# Patient Record
Sex: Female | Born: 1945 | Race: Black or African American | Hispanic: No | Marital: Married | Smoking: Never smoker
Health system: Southern US, Community
[De-identification: ages and names within clinical notes are randomized; demographics above are authoritative.]

## PROBLEM LIST (undated history)

## (undated) DIAGNOSIS — Z9889 Other specified postprocedural states: Secondary | ICD-10-CM

## (undated) DIAGNOSIS — I10 Essential (primary) hypertension: Secondary | ICD-10-CM

## (undated) DIAGNOSIS — Z9289 Personal history of other medical treatment: Secondary | ICD-10-CM

## (undated) DIAGNOSIS — J45909 Unspecified asthma, uncomplicated: Secondary | ICD-10-CM

## (undated) DIAGNOSIS — E785 Hyperlipidemia, unspecified: Secondary | ICD-10-CM

## (undated) DIAGNOSIS — E119 Type 2 diabetes mellitus without complications: Secondary | ICD-10-CM

## (undated) DIAGNOSIS — I639 Cerebral infarction, unspecified: Secondary | ICD-10-CM

## (undated) DIAGNOSIS — E039 Hypothyroidism, unspecified: Secondary | ICD-10-CM

## (undated) HISTORY — PX: CARPAL TUNNEL RELEASE: SHX101

## (undated) HISTORY — PX: OTHER SURGICAL HISTORY: SHX169

## (undated) HISTORY — DX: Other specified postprocedural states: Z98.890

## (undated) HISTORY — PX: APPENDECTOMY: SHX54

## (undated) HISTORY — DX: Hypothyroidism, unspecified: E03.9

## (undated) HISTORY — DX: Personal history of other medical treatment: Z92.89

## (undated) HISTORY — PX: CARDIAC CATHETERIZATION: SHX172

## (undated) HISTORY — DX: Hyperlipidemia, unspecified: E78.5

---

## 2008-11-21 DIAGNOSIS — R1310 Dysphagia, unspecified: Secondary | ICD-10-CM | POA: Insufficient documentation

## 2009-09-20 DIAGNOSIS — R109 Unspecified abdominal pain: Secondary | ICD-10-CM | POA: Insufficient documentation

## 2011-06-26 DIAGNOSIS — Z8719 Personal history of other diseases of the digestive system: Secondary | ICD-10-CM | POA: Insufficient documentation

## 2011-06-26 DIAGNOSIS — E119 Type 2 diabetes mellitus without complications: Secondary | ICD-10-CM | POA: Insufficient documentation

## 2011-06-26 DIAGNOSIS — E785 Hyperlipidemia, unspecified: Secondary | ICD-10-CM | POA: Insufficient documentation

## 2013-04-26 ENCOUNTER — Inpatient Hospital Stay (HOSPITAL_COMMUNITY)
Admission: EM | Admit: 2013-04-26 | Discharge: 2013-04-30 | DRG: 287 | Disposition: A | Discharge status: Home / Self Care | Payer: Medicare Other | Attending: Internal Medicine | Admitting: Internal Medicine

## 2013-04-26 ENCOUNTER — Encounter (HOSPITAL_COMMUNITY): Payer: Self-pay | Admitting: *Deleted

## 2013-04-26 ENCOUNTER — Emergency Department (HOSPITAL_COMMUNITY): Payer: Medicare Other

## 2013-04-26 ENCOUNTER — Other Ambulatory Visit (HOSPITAL_COMMUNITY): Payer: Self-pay

## 2013-04-26 DIAGNOSIS — E119 Type 2 diabetes mellitus without complications: Secondary | ICD-10-CM | POA: Diagnosis present

## 2013-04-26 DIAGNOSIS — Z7982 Long term (current) use of aspirin: Secondary | ICD-10-CM

## 2013-04-26 DIAGNOSIS — K219 Gastro-esophageal reflux disease without esophagitis: Secondary | ICD-10-CM | POA: Diagnosis present

## 2013-04-26 DIAGNOSIS — R0789 Other chest pain: Principal | ICD-10-CM | POA: Diagnosis present

## 2013-04-26 DIAGNOSIS — I10 Essential (primary) hypertension: Secondary | ICD-10-CM | POA: Diagnosis present

## 2013-04-26 DIAGNOSIS — H8110 Benign paroxysmal vertigo, unspecified ear: Secondary | ICD-10-CM | POA: Diagnosis present

## 2013-04-26 DIAGNOSIS — Z79899 Other long term (current) drug therapy: Secondary | ICD-10-CM

## 2013-04-26 DIAGNOSIS — Z9849 Cataract extraction status, unspecified eye: Secondary | ICD-10-CM

## 2013-04-26 DIAGNOSIS — E039 Hypothyroidism, unspecified: Secondary | ICD-10-CM | POA: Diagnosis present

## 2013-04-26 DIAGNOSIS — Z8673 Personal history of transient ischemic attack (TIA), and cerebral infarction without residual deficits: Secondary | ICD-10-CM

## 2013-04-26 DIAGNOSIS — R079 Chest pain, unspecified: Secondary | ICD-10-CM

## 2013-04-26 DIAGNOSIS — E785 Hyperlipidemia, unspecified: Secondary | ICD-10-CM | POA: Diagnosis present

## 2013-04-26 DIAGNOSIS — M47814 Spondylosis without myelopathy or radiculopathy, thoracic region: Secondary | ICD-10-CM | POA: Diagnosis present

## 2013-04-26 DIAGNOSIS — R9439 Abnormal result of other cardiovascular function study: Secondary | ICD-10-CM | POA: Diagnosis not present

## 2013-04-26 DIAGNOSIS — I2510 Atherosclerotic heart disease of native coronary artery without angina pectoris: Secondary | ICD-10-CM | POA: Diagnosis present

## 2013-04-26 DIAGNOSIS — Z9089 Acquired absence of other organs: Secondary | ICD-10-CM

## 2013-04-26 DIAGNOSIS — R42 Dizziness and giddiness: Secondary | ICD-10-CM

## 2013-04-26 DIAGNOSIS — I517 Cardiomegaly: Secondary | ICD-10-CM

## 2013-04-26 DIAGNOSIS — Z86711 Personal history of pulmonary embolism: Secondary | ICD-10-CM

## 2013-04-26 DIAGNOSIS — E7800 Pure hypercholesterolemia, unspecified: Secondary | ICD-10-CM | POA: Diagnosis present

## 2013-04-26 HISTORY — DX: Cerebral infarction, unspecified: I63.9

## 2013-04-26 HISTORY — DX: Type 2 diabetes mellitus without complications: E11.9

## 2013-04-26 HISTORY — DX: Essential (primary) hypertension: I10

## 2013-04-26 LAB — CBC WITH DIFFERENTIAL/PLATELET
Basophils Absolute: 0.1 10*3/uL (ref 0.0–0.1)
Basophils Relative: 1 % (ref 0–1)
Eosinophils Absolute: 0.3 10*3/uL (ref 0.0–0.7)
HCT: 41.3 % (ref 36.0–46.0)
Hemoglobin: 14.2 g/dL (ref 12.0–15.0)
Lymphocytes Relative: 29 % (ref 12–46)
MCH: 30.5 pg (ref 26.0–34.0)
MCHC: 34.4 g/dL (ref 30.0–36.0)
MCV: 88.6 fL (ref 78.0–100.0)
Monocytes Absolute: 0.5 10*3/uL (ref 0.1–1.0)
Neutro Abs: 4.8 10*3/uL (ref 1.7–7.7)
Neutrophils Relative %: 61 % (ref 43–77)
RDW: 12.8 % (ref 11.5–15.5)
WBC: 8 10*3/uL (ref 4.0–10.5)

## 2013-04-26 LAB — RAPID URINE DRUG SCREEN, HOSP PERFORMED
Benzodiazepines: NOT DETECTED
Cocaine: NOT DETECTED
Opiates: NOT DETECTED

## 2013-04-26 LAB — GLUCOSE, CAPILLARY
Glucose-Capillary: 91 mg/dL (ref 70–99)
Glucose-Capillary: 165 mg/dL — ABNORMAL HIGH (ref 70–99)

## 2013-04-26 LAB — BASIC METABOLIC PANEL
CO2: 29 mEq/L (ref 19–32)
Chloride: 100 mEq/L (ref 96–112)
Creatinine, Ser: 0.81 mg/dL (ref 0.50–1.10)
GFR calc Af Amer: 84 mL/min — ABNORMAL LOW (ref >90)
Potassium: 3.7 mEq/L (ref 3.5–5.1)

## 2013-04-26 LAB — CREATININE, SERUM
GFR calc Af Amer: 76 mL/min — ABNORMAL LOW (ref >90)
GFR calc non Af Amer: 66 mL/min — ABNORMAL LOW (ref >90)

## 2013-04-26 LAB — CBC
HCT: 40.1 % (ref 36.0–46.0)
MCHC: 32.9 g/dL (ref 30.0–36.0)
RBC: 4.51 MIL/uL (ref 3.87–5.11)
RDW: 12.8 % (ref 11.5–15.5)
WBC: 8.4 10*3/uL (ref 4.0–10.5)

## 2013-04-26 LAB — TROPONIN I: Troponin I: <0.3 ng/mL (ref <0.30)

## 2013-04-26 LAB — POCT I-STAT TROPONIN I

## 2013-04-26 MED ORDER — IRBESARTAN 300 MG PO TABS
300.0000 mg | ORAL_TABLET | Freq: Every day | ORAL | Status: DC
  Administered 2013-04-26 – 2013-04-30 (×5): 300 mg via ORAL
  Filled 2013-04-26 (×5): qty 1

## 2013-04-26 MED ORDER — AMLODIPINE BESYLATE 5 MG PO TABS
5.0000 mg | ORAL_TABLET | Freq: Every day | ORAL | Status: DC
  Administered 2013-04-26 – 2013-04-30 (×5): 5 mg via ORAL
  Filled 2013-04-26 (×5): qty 1

## 2013-04-26 MED ORDER — NITROGLYCERIN 2 % TD OINT
1.0000 [in_us] | TOPICAL_OINTMENT | Freq: Once | TRANSDERMAL | Status: AC
  Administered 2013-04-26: 1 [in_us] via TOPICAL
  Filled 2013-04-26: qty 1

## 2013-04-26 MED ORDER — OXYBUTYNIN CHLORIDE ER 5 MG PO TB24
5.0000 mg | ORAL_TABLET | Freq: Every day | ORAL | Status: DC
  Administered 2013-04-27 – 2013-04-30 (×4): 5 mg via ORAL
  Filled 2013-04-26 (×5): qty 1

## 2013-04-26 MED ORDER — PANTOPRAZOLE SODIUM 40 MG PO TBEC: 40.0000 mg | DELAYED_RELEASE_TABLET | Freq: Every day | ORAL | Status: DC

## 2013-04-26 MED ORDER — REGADENOSON 0.4 MG/5ML IV SOLN
0.4000 mg | Freq: Once | INTRAVENOUS | Status: DC
Start: 2013-04-27 — End: 2013-04-29
  Filled 2013-04-26: qty 5

## 2013-04-26 MED ORDER — LEVOTHYROXINE SODIUM 75 MCG PO TABS
75.0000 ug | ORAL_TABLET | Freq: Every day | ORAL | Status: DC
  Administered 2013-04-27 – 2013-04-30 (×4): 75 ug via ORAL
  Filled 2013-04-26 (×5): qty 1

## 2013-04-26 MED ORDER — ACETAMINOPHEN 325 MG PO TABS
650.0000 mg | ORAL_TABLET | ORAL | Status: DC | PRN
  Filled 2013-04-26: qty 2

## 2013-04-26 MED ORDER — OLMESARTAN-AMLODIPINE-HCTZ 40-5-12.5 MG PO TABS: 1.0000 | ORAL_TABLET | Freq: Every day | ORAL | Status: DC

## 2013-04-26 MED ORDER — GI COCKTAIL ~~LOC~~: 30.0000 mL | Freq: Four times a day (QID) | ORAL | Status: DC | PRN

## 2013-04-26 MED ORDER — ASPIRIN EC 325 MG PO TBEC
325.0000 mg | DELAYED_RELEASE_TABLET | Freq: Every day | ORAL | Status: DC
  Administered 2013-04-28 – 2013-04-30 (×3): 325 mg via ORAL
  Filled 2013-04-26 (×3): qty 1

## 2013-04-26 MED ORDER — ENOXAPARIN SODIUM 40 MG/0.4ML ~~LOC~~ SOLN
40.0000 mg | SUBCUTANEOUS | Status: DC
  Administered 2013-04-26 – 2013-04-28 (×3): 40 mg via SUBCUTANEOUS
  Filled 2013-04-26 (×4): qty 0.4

## 2013-04-26 MED ORDER — ATORVASTATIN CALCIUM 80 MG PO TABS
80.0000 mg | ORAL_TABLET | Freq: Every day | ORAL | Status: DC
  Administered 2013-04-26 – 2013-04-30 (×5): 80 mg via ORAL
  Filled 2013-04-26 (×5): qty 1

## 2013-04-26 MED ORDER — HYDROCHLOROTHIAZIDE 12.5 MG PO CAPS
12.5000 mg | ORAL_CAPSULE | Freq: Every day | ORAL | Status: DC
  Administered 2013-04-26 – 2013-04-30 (×4): 12.5 mg via ORAL
  Filled 2013-04-26 (×5): qty 1

## 2013-04-26 MED ORDER — ASPIRIN 81 MG PO CHEW
324.0000 mg | CHEWABLE_TABLET | Freq: Once | ORAL | Status: AC
  Administered 2013-04-26: 324 mg via ORAL
  Filled 2013-04-26: qty 4

## 2013-04-26 MED ORDER — ONDANSETRON HCL 4 MG/2ML IJ SOLN
4.0000 mg | Freq: Four times a day (QID) | INTRAMUSCULAR | Status: DC | PRN
  Administered 2013-04-27: 4 mg via INTRAVENOUS
  Filled 2013-04-26: qty 2

## 2013-04-26 NOTE — H&P (Signed)
Date: 04/26/2013               Patient Name:  Katrina Swanson MRN: [id number]  DOB: Feb 02, 1946 Age / Sex: 67 y.o., female   PCP: Dr. Fanny Dance, MD [facility], [place])         Medical Service: Internal Medicine Teaching Service         Attending Physician: Dr. Inez Catalina, MD    First Contact: Dr. Delane Ginger Pager: [phone]  Second Contact: Dr. Dorise Hiss Pager: [phone]       After Hours (After 5p/  First Contact Pager: [phone]  weekends / holidays): Second Contact Pager: [phone]   Chief Complaint: Chest Pain  History of Present Illness: Katrina Swanson is a 67 yo AAF from [place], [place] with a PMH of HTN, DM2 (pt reports last HA1C 6.1), dyslipidemia, hypothyroidism, GERD, and CVA (2012). She presents to the ED with 2-3 weeks of "on and off" substernal chest pain with radiation into her left shoulder and under her left breast. She describes the pain as "dull like anxiety" and lasts about 20-30 minutes at a time. She endorses some associated HA, SOB, nausea (no emesis), and dizziness. She reports the pain is mainly nonexertional and happens mainly after she "has eaten too much" or when she lies down. She has dyspepsia at times but not as long as she takes her protonix and zantac. She denies any new exercises or trauma to the chest, however she does state that she sometimes works in her flower garden and has some associated chest pain but this feels different to her. She states the pain is non-tender to palpation and not associated with inspiration or changes in position. She denies any episodes of similar pain in the past. 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   Of note, she reportedly saw her PCP for these issues who told her she  needed a MRI to r/o a new stroke. Therefore, she was initially worked up as a possible CVA.   Meds:  Prescriptions prior to admission  Medication Sig Dispense Refill  . aspirin 325 MG tablet Take 325 mg by mouth daily.      Marland Kitchen atorvastatin (LIPITOR) 80 MG tablet Take 80 mg by mouth daily.      Marland Kitchen levothyroxine (SYNTHROID, LEVOTHROID) 75 MCG tablet Take 75 mcg by mouth daily before breakfast.      . metFORMIN (GLUCOPHAGE) 500 MG tablet Take 500 mg by mouth 2 (two) times daily with a meal.      . nitroGLYCERIN (NITROSTAT) 0.4 MG SL tablet Place 0.4 mg under the tongue every 5 (five) minutes as needed for chest pain.      . Olmesartan-Amlodipine-HCTZ (TRIBENZOR) 40-5-12.5 MG TABS Take 1 tablet by mouth daily.      Marland Kitchen oxybutynin (DITROPAN-XL) 5 MG 24 hr tablet Take 5 mg by mouth daily.      . pantoprazole (PROTONIX) 40 MG tablet Take 40 mg by mouth daily.      . ranitidine (ZANTAC) 150 MG tablet Take 150 mg by mouth 2 (two) times daily.        Current Facility-Administered Medications  Medication Dose Route Frequency Provider Last Rate Last Dose  .  acetaminophen (TYLENOL) tablet 650 mg  650 mg Oral Q4H PRN Manuela Schwartz, MD      . amLODipine (NORVASC) tablet 5 mg  5 mg Oral Daily Abran [facility], [facility]      . [START ON 04/27/2013] aspirin EC tablet 325 mg  325 mg Oral Daily Manuela Schwartz, MD      . atorvastatin (LIPITOR) tablet 80 mg  80 mg Oral Daily Manuela Schwartz, MD      . enoxaparin (LOVENOX) injection 40 mg  40 mg Subcutaneous Q24H Manuela Schwartz, MD   40 mg at 04/26/13 1610  . gi cocktail (Maalox,Lidocaine,Donnatal)  30 mL Oral QID PRN Manuela Schwartz, MD      . hydrochlorothiazide (MICROZIDE) capsule 12.5 mg  12.5 mg Oral Daily Abran [facility], [facility]      . irbesartan (AVAPRO) tablet 300 mg  300 mg Oral Daily Abran [facility], [facility]      . [START ON 04/27/2013] levothyroxine (SYNTHROID, LEVOTHROID) tablet 75 mcg  75 mcg Oral QAC breakfast Manuela Schwartz, MD      . ondansetron [facility]) injection 4 mg  4 mg Intravenous Q6H PRN Manuela Schwartz, MD      . oxybutynin (DITROPAN-XL) 24 hr tablet 5 mg  5 mg Oral Daily Manuela Schwartz, MD      . pantoprazole (PROTONIX) EC tablet 40 mg  40 mg Oral Daily Manuela Schwartz, MD      . Melene Muller ON 04/27/2013] regadenoson (LEXISCAN) injection SOLN 0.4 mg  0.4 mg Intravenous Once Vesta Mixer, MD        Allergies: Allergies as of 04/26/2013  . (No Known Allergies)   Past Medical History  Diagnosis Date  . Stroke     2012  . Diabetes mellitus without complication   . Hypertension   . High cholesterol   . Thyroid disease     hypothyroid   Past Surgical History  Procedure Laterality Date  . Appendectomy    . Carpal tunnel release    . Cataracts     History reviewed. No pertinent family history. History   Social History  . Marital Status: Married    Spouse Name: N/A    Number of Children: N/A  . Years of Education: N/A   Occupational History  . Not on file.   Social History Main Topics  . Smoking status: Never Smoker   . Smokeless tobacco: Not on file  . Alcohol Use: No  . Drug Use: No  . Sexual Activity: Not on file   Other Topics Concern  . Not on file   Social History Narrative  . No narrative on file    Review of Systems: Pertinent items are noted in HPI.  Physical Exam: Blood pressure 133/79, pulse 71, temperature 98.4 F (36.9 C), temperature source Oral, resp. rate 14, height 5\' 4"  (1.626 m), weight 81.194 kg (179 lb), SpO2 100.00%.  Constitutional: Vital signs reviewed.  Patient is a well-developed and well-nourished female in no acute distress and cooperative with exam.  Head: Normocephalic and atraumatic. Eyes: PERRL, EOMI, conjunctivae normal, No scleral icterus.  Neck: Supple, Trachea midline, no JVD noted.  Cardiovascular: RRR, S1 normal, S2 normal, no MRG, pulses symmetric and intact bilaterally; no LE edema. Pulmonary/Chest:  normal respiratory effort, CTAB, no wheezes, rales, or rhonchi Abdominal: Soft. Non-tender, non-distended, bowel sounds are normal. Musculoskeletal: No joint deformities, erythema noted.  Neurological: A&O x3, cranial nerve II-XII are grossly intact, no focal motor deficit  noted. Skin: Warm, dry and intact. No rash, cyanosis, or clubbing.  Psychiatric: Normal mood and affect.   Lab results: Basic Metabolic Panel:  Recent Labs  96/04/54 0630  NA 139  K 3.7  CL 100  CO2 29  GLUCOSE 101*  BUN 10  CREATININE 0.81  CALCIUM 9.5   CBC:  Recent Labs  04/26/13 0630  WBC 8.0  NEUTROABS 4.8  HGB 14.2  HCT 41.3  MCV 88.6  PLT 271   Cardiac Enzymes:  Troponin (Point of Care Test)  Recent Labs  04/26/13 1247  TROPIPOC 0.00    Recent Labs  04/26/13 0630  TROPONINI <0.30   CBG:  Recent Labs  04/26/13 0703  GLUCAP 91   Urine Drug Screen: Drugs of Abuse     Component Value Date/Time   LABOPIA NONE DETECTED 04/26/2013 1041   COCAINSCRNUR NONE DETECTED 04/26/2013 1041   LABBENZ NONE DETECTED 04/26/2013 1041   AMPHETMU NONE DETECTED 04/26/2013 1041   THCU NONE DETECTED 04/26/2013 1041   LABBARB NONE DETECTED 04/26/2013 1041    Alcohol Level:  Recent Labs  04/26/13 1230  ETH <11   Imaging results:  Dg Chest 2 View  04/26/2013   CLINICAL DATA:  Chest pain  EXAM: CHEST  2 VIEW  COMPARISON:  None.  FINDINGS: Thoracic spondylosis noted. Cardiac and mediastinal margins appear normal. The lungs appear clear. No pleural effusion identified.  IMPRESSION: 1. No acute radiographic findings. 2. Thoracic spondylosis.   Electronically Signed   By: Herbie Baltimore   On: 04/26/2013 07:07   Ct Head Wo Contrast  04/26/2013   CLINICAL DATA:  Dizziness. Anxiety. Nausea. Headache.  EXAM: CT HEAD WITHOUT CONTRAST  TECHNIQUE: Contiguous axial images were obtained from the base of the skull through the vertex without intravenous contrast.  COMPARISON:  None.  FINDINGS: The brainstem,  cerebellum, cerebral peduncles, thalamus, basal ganglia, basilar cisterns, and ventricular system appear within normal limits. Periventricular white matter and corona radiata hypodensities favor chronic ischemic microvascular white matter disease. No intracranial hemorrhage, mass lesion, or acute CVA.  IMPRESSION: 1. Periventricular white matter and corona radiata hypodensities favor chronic ischemic microvascular white matter disease. 2. No acute intracranial findings.   Electronically Signed   By: Herbie Baltimore   On: 04/26/2013 08:14   Mr Brain Wo Contrast  04/26/2013   *RADIOLOGY REPORT*  Clinical Data: Dizziness and nausea.  Possible CVA.  MRI HEAD WITHOUT CONTRAST  Technique:  Multiplanar, multiecho pulse sequences of the brain and surrounding structures were obtained according to standard protocol without intravenous contrast.  Comparison: 04/26/2013 CT.  Findings:  There is no evidence for acute infarction, intracranial hemorrhage, mass lesion, hydrocephalus, or extra-axial fluid.  Mild atrophy.  Moderate chronic microvascular ischemic change affecting the periventricular and subcortical white matter.  No foci of chronic hemorrhage.  Normal midline structures.  Flow voids are maintained.  No osseous lesions.  Bilateral cataract extraction. No acute sinus or mastoid disease.  IMPRESSION: No acute intracranial findings.  Mild atrophy with moderate chronic microvascular ischemic change.   Original Report Authenticated By: Davonna Belling, M.D.    Other results: ED ECG REPORT   Date: 04/26/2013  EKG Time: 06:12 AM  Rate: 64  Rhythm: normal sinus rhythm  Axis: Normal  Intervals:Normal  ST&T Change: No old EKG for comparison     Assessment & Plan by Problem:  Ms. Spanier is a 67 yo AAF from [place], [place] with a PMH of HTN, DM2 (pt reports last HA1C 6.1), dyslipidemia,  hypothyroidism, GERD, and CVA who presents to the ED with 2-3 weeks of substernal chest pain.   1. Atypical Chest Pain- Pt has  been experiencing 2-3 weeks of intermittent substernal chest pain radiating into the left shoulder and under the breast. She also has associated SOB, nausea, dizziness, and HA. CXR reveals no acute findings but thoracic spondylosis. CT of head reveals periventricular white matter and corona radiata hypodensities which favor chronic ischemic white matter disease with no acute intracranial findings. MRI of brain reveals no acute intracranial findings but mild atrophy with chronic microvascular ischemic change. She does have risk factors for CAD and will work up as a ACS r/o. TIMI score: 3 which is a 13% risk at 14 days of all-cause mortality, new or recurrent MI, or severe recurrent ischemia requiring urgent revascularization. Wells Score: 1.5 which is low risk. Possible etiologies include: ACS, PE, Anxiety, GERD, Pneumothorax, Aortic Dissection. POCT was negative.   -troponins X 2 -telemetry -TTE -consult cardiology -zofran,  -continue ASA 325mg  -BMP, CBC in am  2. DM2- d/c metformin -SSI   3. Dyslipidemia- continue home meds  4. Hypothyroidism- continue home meds  5. HTN- continue home meds  6. GERD-  -GI cocktail PRN   Dispo: Disposition is deferred at this time, awaiting improvement of current medical problems. Anticipated discharge in approximately 1-2 day(s).   The patient does have a current PCP (Caren Cleveland, MD-[place], [place]; phone: [phone]) and does need an [facility] hospital follow-up appointment after discharge.   Signed: Boykin Peek, MD 04/26/2013, 4:24 PM

## 2013-04-26 NOTE — ED Notes (Signed)
Patient transported to MRI 

## 2013-04-26 NOTE — ED Provider Notes (Signed)
CSN: [id number]     Arrival date & time 04/26/13  0607 History   First MD Initiated Contact with Patient 04/26/13 [phone]     Chief Complaint  Patient presents with  . Chest Pain   (Consider location/radiation/quality/duration/timing/severity/associated sxs/prior Treatment) HPI Comments: Patient here with a two week history of intermittent left anterior chest pain, mild shortness of breath, nausea and headache.  She reports that she is here visiting from [place] and states that she has seen her PCP for these problems and states that she told her she will need a MRI to rule out a new stroke.  She reports CVA in the past with only vision issues.  She reports that the headache is daily for two weeks, generalized, associated with the chest pain and the nausea.  She reports that she has taken her nitroglycerin for the chest pain which has helped some.  She denies radiation of the pain, cough, and states that the pain is not reproducible.  She states that she feels a "anxiety" or fluttering feeling at times in her chest as well.  Reports no previous history of CAD.  She reports that she has also felt dizziness, mainly with standing or walking.  She denies a real ataxic gait but reports that she has also "walked into the door jam" in the past several weeks as well.  Patient is a 67 y.o. female presenting with chest pain. The history is provided by the patient. No language interpreter was used.  Chest Pain Pain location:  L chest Pain quality: aching, pressure and tightness   Pain quality: not radiating, not shooting, not stabbing and not tearing   Pain radiates to:  Does not radiate Pain radiates to the back: no   Pain severity:  Mild Timing:  Intermittent Progression:  Worsening Chronicity:  Recurrent Context: not breathing, not eating, no movement and no stress   Relieved by:  Nothing Worsened by:  Nothing tried Ineffective treatments:  None tried Associated symptoms: anxiety, dizziness, fatigue,  headache and nausea   Associated symptoms: no abdominal pain, no altered mental status, no anorexia, no back pain, no cough, no diaphoresis, no fever, no near-syncope, no numbness, no palpitations, no shortness of breath, no syncope, not vomiting and no weakness     Past Medical History  Diagnosis Date  . Stroke     2012  . Diabetes mellitus without complication   . Hypertension   . High cholesterol   . Thyroid disease     hypothyroid   Past Surgical History  Procedure Laterality Date  . Appendectomy    . Carpal tunnel release    . Cataracts     History reviewed. No pertinent family history. History  Substance Use Topics  . Smoking status: Never Smoker   . Smokeless tobacco: Not on file  . Alcohol Use: No   OB History   Grav Para Term Preterm Abortions TAB SAB Ect Mult Living                 Review of Systems  Constitutional: Positive for fatigue. Negative for fever and diaphoresis.  Respiratory: Negative for cough and shortness of breath.   Cardiovascular: Positive for chest pain. Negative for palpitations, syncope and near-syncope.  Gastrointestinal: Positive for nausea. Negative for vomiting, abdominal pain and anorexia.  Musculoskeletal: Negative for back pain.  Neurological: Positive for dizziness and headaches. Negative for weakness and numbness.  All other systems reviewed and are negative.    Allergies  Review of patient's  allergies indicates no known allergies.  Home Medications   Current Outpatient Rx  Name  Route  Sig  Dispense  Refill  . aspirin 325 MG tablet   Oral   Take 325 mg by mouth daily.         Marland Kitchen atorvastatin (LIPITOR) 80 MG tablet   Oral   Take 80 mg by mouth daily.         Marland Kitchen levothyroxine (SYNTHROID, LEVOTHROID) 75 MCG tablet   Oral   Take 75 mcg by mouth daily before breakfast.         . metFORMIN (GLUCOPHAGE) 500 MG tablet   Oral   Take 500 mg by mouth 2 (two) times daily with a meal.         . nitroGLYCERIN  (NITROSTAT) 0.4 MG SL tablet   Sublingual   Place 0.4 mg under the tongue every 5 (five) minutes as needed for chest pain.         . Olmesartan-Amlodipine-HCTZ (TRIBENZOR) 40-5-12.5 MG TABS   Oral   Take 1 tablet by mouth daily.         Marland Kitchen oxybutynin (DITROPAN-XL) 5 MG 24 hr tablet   Oral   Take 5 mg by mouth daily.         . pantoprazole (PROTONIX) 40 MG tablet   Oral   Take 40 mg by mouth daily.         . ranitidine (ZANTAC) 150 MG tablet   Oral   Take 150 mg by mouth 2 (two) times daily.          BP 152/84  Pulse 69  Temp(Src) 98.2 F (36.8 C) (Oral)  Resp 12  SpO2 96% Physical Exam  Nursing note and vitals reviewed. Constitutional: She is oriented to person, place, and time. She appears well-developed and well-nourished. No distress.  HENT:  Head: Normocephalic and atraumatic.  Right Ear: External ear normal.  Left Ear: External ear normal.  Nose: Nose normal.  Mouth/Throat: Oropharynx is clear and moist. No oropharyngeal exudate.  Eyes: Conjunctivae and EOM are normal. Pupils are equal, round, and reactive to light. No scleral icterus.  Neck: Normal range of motion. Neck supple. Normal carotid pulses and no JVD present. Carotid bruit is not present.  Cardiovascular: Normal rate, regular rhythm, normal heart sounds and intact distal pulses.  Exam reveals no gallop and no friction rub.   No murmur heard. Pulmonary/Chest: Breath sounds normal. No respiratory distress. She has no wheezes. She has no rales. She exhibits no tenderness.  Abdominal: Soft. Bowel sounds are normal. She exhibits no distension. There is no tenderness.  Musculoskeletal: Normal range of motion. She exhibits no edema and no tenderness.  Lymphadenopathy:    She has no cervical adenopathy.  Neurological: She is alert and oriented to person, place, and time. No cranial nerve deficit. She exhibits normal muscle tone. Coordination normal.  Skin: Skin is warm and dry. No rash noted. No  erythema. No pallor.  Psychiatric: She has a normal mood and affect. Her behavior is normal. Judgment and thought content normal.    ED Course  Procedures (including critical care time) Labs Review Labs Reviewed  CBC WITH DIFFERENTIAL  BASIC METABOLIC PANEL  TROPONIN I   Results for orders placed during the hospital encounter of 04/26/13  CBC WITH DIFFERENTIAL      Result Value Range   WBC 8.0  4.0 - 10.5 K/uL   RBC 4.66  3.87 - 5.11 MIL/uL   Hemoglobin 14.2  12.0 - 15.0 g/dL   HCT 95.6  21.3 - 08.6 %   MCV 88.6  78.0 - 100.0 fL   MCH 30.5  26.0 - 34.0 pg   MCHC 34.4  30.0 - 36.0 g/dL   RDW 57.8  46.9 - 62.9 %   Platelets 271  150 - 400 K/uL   Neutrophils Relative % 61  43 - 77 %   Neutro Abs 4.8  1.7 - 7.7 K/uL   Lymphocytes Relative 29  12 - 46 %   Lymphs Abs 2.3  0.7 - 4.0 K/uL   Monocytes Relative 6  3 - 12 %   Monocytes Absolute 0.5  0.1 - 1.0 K/uL   Eosinophils Relative 3  0 - 5 %   Eosinophils Absolute 0.3  0.0 - 0.7 K/uL   Basophils Relative 1  0 - 1 %   Basophils Absolute 0.1  0.0 - 0.1 K/uL  BASIC METABOLIC PANEL      Result Value Range   Sodium 139  135 - 145 mEq/L   Potassium 3.7  3.5 - 5.1 mEq/L   Chloride 100  96 - 112 mEq/L   CO2 29  19 - 32 mEq/L   Glucose, Bld 101 (*) 70 - 99 mg/dL   BUN 10  6 - 23 mg/dL   Creatinine, Ser 5.28  0.50 - 1.10 mg/dL   Calcium 9.5  8.4 - 41.3 mg/dL   GFR calc non Af Amer 72 (*) >90 mL/min   GFR calc Af Amer 84 (*) >90 mL/min  TROPONIN I      Result Value Range   Troponin I <0.30  <0.30 ng/mL  GLUCOSE, CAPILLARY      Result Value Range   Glucose-Capillary 91  70 - 99 mg/dL  URINE RAPID DRUG SCREEN (HOSP PERFORMED)      Result Value Range   Opiates NONE DETECTED  NONE DETECTED   Cocaine NONE DETECTED  NONE DETECTED   Benzodiazepines NONE DETECTED  NONE DETECTED   Amphetamines NONE DETECTED  NONE DETECTED   Tetrahydrocannabinol NONE DETECTED  NONE DETECTED   Barbiturates NONE DETECTED  NONE DETECTED   Dg Chest 2  View  04/26/2013   CLINICAL DATA:  Chest pain  EXAM: CHEST  2 VIEW  COMPARISON:  None.  FINDINGS: Thoracic spondylosis noted. Cardiac and mediastinal margins appear normal. The lungs appear clear. No pleural effusion identified.  IMPRESSION: 1. No acute radiographic findings. 2. Thoracic spondylosis.   Electronically Signed   By: Herbie Baltimore   On: 04/26/2013 07:07   Ct Head Wo Contrast  04/26/2013   CLINICAL DATA:  Dizziness. Anxiety. Nausea. Headache.  EXAM: CT HEAD WITHOUT CONTRAST  TECHNIQUE: Contiguous axial images were obtained from the base of the skull through the vertex without intravenous contrast.  COMPARISON:  None.  FINDINGS: The brainstem, cerebellum, cerebral peduncles, thalamus, basal ganglia, basilar cisterns, and ventricular system appear within normal limits. Periventricular white matter and corona radiata hypodensities favor chronic ischemic microvascular white matter disease. No intracranial hemorrhage, mass lesion, or acute CVA.  IMPRESSION: 1. Periventricular white matter and corona radiata hypodensities favor chronic ischemic microvascular white matter disease. 2. No acute intracranial findings.   Electronically Signed   By: Herbie Baltimore   On: 04/26/2013 08:14   Mr Brain Wo Contrast  04/26/2013   *RADIOLOGY REPORT*  Clinical Data: Dizziness and nausea.  Possible CVA.  MRI HEAD WITHOUT CONTRAST  Technique:  Multiplanar, multiecho pulse sequences of the brain and surrounding  structures were obtained according to standard protocol without intravenous contrast.  Comparison: 04/26/2013 CT.  Findings:  There is no evidence for acute infarction, intracranial hemorrhage, mass lesion, hydrocephalus, or extra-axial fluid.  Mild atrophy.  Moderate chronic microvascular ischemic change affecting the periventricular and subcortical white matter.  No foci of chronic hemorrhage.  Normal midline structures.  Flow voids are maintained.  No osseous lesions.  Bilateral cataract extraction. No  acute sinus or mastoid disease.  IMPRESSION: No acute intracranial findings.  Mild atrophy with moderate chronic microvascular ischemic change.   Original Report Authenticated By: Davonna Belling, M.D.    Date: 04/26/2013  Rate: 64  Rhythm: Sinus  QRS Axis: normal  Intervals: normal  ST/T Wave abnormalities: Borderline t wave changes anteroseptally  Conduction Disutrbances:None  Narrative Interpretation: reviewed by Dr. Norlene Campbell  Old EKG Reviewed: No old in our system, but review from MD's office on 9/23 shows no acute changes.    Imaging Review No results found.  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  MRI of brain reveals microvascular disease but no acute or subacute findings.  Her second complaint is chest pain for the past 2 weeks - she was seen by her PCP 3 days ago and we have gotten records from this visit.  They show no acute changes on EKG but symptoms concerning for new CAD and anginal symptoms.  Will admit to internal medicine teaching service.   Izola Price Marisue Humble, PA-C 04/26/13 1231

## 2013-04-26 NOTE — Consult Note (Signed)
CONSULT NOTE  Date: 04/26/2013               Patient Name:  Katrina Swanson MRN: [id number]  DOB: 04/17/1946 Age / Sex: 67 y.o., female        PCP: No PCP Per Patient Primary Cardiologist: New to [place]            Referring Physician: Dr. Criselda Peaches              Reason for Consult:  CAD, Chest pain           History of Present Illness: Patient is a 67 y.o. female with a PMHx of cerebral vascular disease, who was admitted to [facility] on 04/26/2013 for evaluation of multiple symptoms of lightheadness, orthostatis, vertigo, stumbling, chest pain.   The CP has been present for several weeks.  She had 1 particularly bad night  ~ 2 week ago during which she took some NTG.   Since that time, she has had frequent episodes of CP.  Not associated with exercise ( does not exercise) .    The pain is a tight sensation - no dyspnea and is not associated with weakness or dizziness.  Working around the house does not cause the CP.   These last for several minutes, radiate to her left arm.  She has taked NTG on several occasions.   She was scheduled to see Dr. Eden Emms but could not wait until the apt because of worsening weakness and loss of balance.   Her main complaint is that of progressive weakness and dizziness.  This has also been going on for 2 weeks.  She had a hx of CVA 2 years and her medical doctor thought that she may have had another one.     Medications: Outpatient medications: Prescriptions prior to admission  Medication Sig Dispense Refill  . aspirin 325 MG tablet Take 325 mg by mouth daily.      Marland Kitchen atorvastatin (LIPITOR) 80 MG tablet Take 80 mg by mouth daily.      Marland Kitchen levothyroxine (SYNTHROID, LEVOTHROID) 75 MCG tablet Take 75 mcg by mouth daily before breakfast.      . metFORMIN (GLUCOPHAGE) 500 MG tablet Take 500 mg by mouth 2 (two) times daily with a meal.      . nitroGLYCERIN (NITROSTAT) 0.4 MG SL tablet Place 0.4 mg under the tongue every 5 (five) minutes as needed for chest pain.      .  Olmesartan-Amlodipine-HCTZ (TRIBENZOR) 40-5-12.5 MG TABS Take 1 tablet by mouth daily.      Marland Kitchen oxybutynin (DITROPAN-XL) 5 MG 24 hr tablet Take 5 mg by mouth daily.      . pantoprazole (PROTONIX) 40 MG tablet Take 40 mg by mouth daily.      . ranitidine (ZANTAC) 150 MG tablet Take 150 mg by mouth 2 (two) times daily.        Current medications: Current Facility-Administered Medications  Medication Dose Route Frequency Provider Last Rate Last Dose  . acetaminophen (TYLENOL) tablet 650 mg  650 mg Oral Q4H PRN Manuela Schwartz, MD      . amLODipine (NORVASC) tablet 5 mg  5 mg Oral Daily Abran Duke, [facility] 2      . [START ON 04/27/2013] aspirin EC tablet 325 mg  325 mg Oral Daily Manuela Schwartz, MD      . atorvastatin (LIPITOR) tablet 80 mg  80 mg Oral Daily Manuela Schwartz, MD      . enoxaparin (LOVENOX) injection  40 mg  40 mg Subcutaneous Q24H Manuela Schwartz, MD      . gi cocktail (Maalox,Lidocaine,Donnatal)  30 mL Oral QID PRN Manuela Schwartz, MD      . hydrochlorothiazide (MICROZIDE) capsule 12.5 mg  12.5 mg Oral Daily Abran Duke, [facility]      . irbesartan (AVAPRO) tablet 300 mg  300 mg Oral Daily Abran Duke, [facility]      . [START ON 04/27/2013] levothyroxine (SYNTHROID, LEVOTHROID) tablet 75 mcg  75 mcg Oral QAC breakfast Manuela Schwartz, MD      . ondansetron [facility]) injection 4 mg  4 mg Intravenous Q6H PRN Manuela Schwartz, MD      . oxybutynin (DITROPAN-XL) 24 hr tablet 5 mg  5 mg Oral Daily Manuela Schwartz, MD      . pantoprazole (PROTONIX) EC tablet 40 mg  40 mg Oral Daily Manuela Schwartz, MD         No Known Allergies   Past Medical History  Diagnosis Date  . Stroke     2012  . Diabetes mellitus without complication   . Hypertension   . High cholesterol   . Thyroid disease     hypothyroid    Past Surgical History  Procedure Laterality Date  . Appendectomy    . Carpal tunnel release    . Cataracts       History reviewed. No pertinent family history.  Social History:  reports that she has never smoked. She does not have any smokeless tobacco history on file. She reports that she does not drink alcohol or use illicit drugs.   Review of Systems: Constitutional:  admits to  Fatigue. Denies fever, chills, diaphoresis, appetite change   HEENT: denies photophobia, eye pain, redness, hearing loss, ear pain, congestion, sore throat, rhinorrhea, sneezing, neck pain, neck stiffness and tinnitus.  Respiratory: admits to  chest tightness,    Cardiovascular: admits to chest pain,  Denies  palpitations and leg swelling.  Gastrointestinal: denies vomiting, abdominal pain, diarrhea, constipation, blood in stool.  She has occasional nausea,   Genitourinary: denies dysuria, urgency, frequency, hematuria, flank pain and difficulty urinating.  Musculoskeletal: denies  myalgias, back pain, joint swelling, arthralgias and gait problem.   Skin: denies pallor, rash and wound.  Neurological: admits to dizziness,  , weakness, light-headedness,  headaches.  Denies any syncope  Hematological: denies adenopathy, easy bruising, personal or family bleeding history.  Psychiatric/ Behavioral: denies suicidal ideation, mood changes, confusion, nervousness, sleep disturbance and agitation.    Physical Exam: BP 133/79  Pulse 71  Temp(Src) 98.4 F (36.9 C) (Oral)  Resp 14  Ht 5\' 4"  (1.626 m)  Wt 179 lb (81.194 kg)  BMI 30.71 kg/m2  SpO2 100%  General: Vital signs reviewed and noted. Well-developed, well-nourished, in no acute distress; alert, appropriate and cooperative throughout examination.  Head: Normocephalic, atraumatic, sclera anicteric, mucus membranes are moist  Neck: Supple. Negative for carotid bruits. JVD not elevated.  Lungs:  Clear bilaterally to auscultation without wheezes, rales, or rhonchi. Breathing is unlabored.  Heart: RRR with S1 S2. No murmurs, rubs, or gallops    Abdomen:  Soft,  non-tender, non-distended with normoactive bowel sounds. No hepatomegaly. No rebound/guarding. No obvious abdominal masses  MSK: Strength and the appear normal for age.  Extremities: No clubbing or cyanosis. No edema.  Distal pedal pulses are 2+ and equal bilaterally.  Neurologic: Alert and oriented X 3. Moves all extremities spontaneously.  Psych: Responds to questions appropriately with a  normal affect.    Lab results: Basic Metabolic Panel:  Recent Labs Lab 04/26/13 0630  NA 139  K 3.7  CL 100  CO2 29  GLUCOSE 101*  BUN 10  CREATININE 0.81  CALCIUM 9.5    Liver Function Tests: No results found for this basename: AST, ALT, ALKPHOS, BILITOT, PROT, ALBUMIN,  in the last 168 hours No results found for this basename: LIPASE, AMYLASE,  in the last 168 hours No results found for this basename: AMMONIA,  in the last 168 hours  CBC:  Recent Labs Lab 04/26/13 0630  WBC 8.0  NEUTROABS 4.8  HGB 14.2  HCT 41.3  MCV 88.6  PLT 271    Cardiac Enzymes:  Recent Labs Lab 04/26/13 0630  TROPONINI <0.30    BNP: No components found with this basename: POCBNP,   CBG:  Recent Labs Lab 04/26/13 0703  GLUCAP 91    Coagulation Studies: No results found for this basename: LABPROT, INR,  in the last 72 hours   Other results:  EKG:  NSR at 64, T wave flattening laterally .   Imaging: Dg Chest 2 View  04/26/2013   CLINICAL DATA:  Chest pain  EXAM: CHEST  2 VIEW  COMPARISON:  None.  FINDINGS: Thoracic spondylosis noted. Cardiac and mediastinal margins appear normal. The lungs appear clear. No pleural effusion identified.  IMPRESSION: 1. No acute radiographic findings. 2. Thoracic spondylosis.   Electronically Signed   By: Herbie Baltimore   On: 04/26/2013 07:07   Ct Head Wo Contrast  04/26/2013   CLINICAL DATA:  Dizziness. Anxiety. Nausea. Headache.  EXAM: CT HEAD WITHOUT CONTRAST  TECHNIQUE: Contiguous axial images were obtained from the base of the skull through the  vertex without intravenous contrast.  COMPARISON:  None.  FINDINGS: The brainstem, cerebellum, cerebral peduncles, thalamus, basal ganglia, basilar cisterns, and ventricular system appear within normal limits. Periventricular white matter and corona radiata hypodensities favor chronic ischemic microvascular white matter disease. No intracranial hemorrhage, mass lesion, or acute CVA.  IMPRESSION: 1. Periventricular white matter and corona radiata hypodensities favor chronic ischemic microvascular white matter disease. 2. No acute intracranial findings.   Electronically Signed   By: Herbie Baltimore   On: 04/26/2013 08:14   Mr Brain Wo Contrast  04/26/2013   *RADIOLOGY REPORT*  Clinical Data: Dizziness and nausea.  Possible CVA.  MRI HEAD WITHOUT CONTRAST  Technique:  Multiplanar, multiecho pulse sequences of the brain and surrounding structures were obtained according to standard protocol without intravenous contrast.  Comparison: 04/26/2013 CT.  Findings:  There is no evidence for acute infarction, intracranial hemorrhage, mass lesion, hydrocephalus, or extra-axial fluid.  Mild atrophy.  Moderate chronic microvascular ischemic change affecting the periventricular and subcortical white matter.  No foci of chronic hemorrhage.  Normal midline structures.  Flow voids are maintained.  No osseous lesions.  Bilateral cataract extraction. No acute sinus or mastoid disease.  IMPRESSION: No acute intracranial findings.  Mild atrophy with moderate chronic microvascular ischemic change.   Original Report Authenticated By: Davonna Belling, M.D.       Assessment & Plan:  1. Chest pain:  Her symptoms are worrisome but still not really typical for angina.  She has had a cath about 3 years ago that revealed nonobstructive disease according to her ( minor narrowings).  She now presents with weakness, unsteadiness as well as some chest tightness for the past several weeks.   She does not smoke.    We will schedule her for a  Steffanie Dunn for tomorrow am or perhaps Sunday if it cannot be done tomorrow.    The medicine team will be addressing her other symptoms.     Vesta Mixer, Montez Hageman., MD, [facility] 04/26/2013, 3:39 PM

## 2013-04-26 NOTE — Progress Notes (Signed)
  Echocardiogram 2D Echocardiogram has been performed.  Georgian Co 04/26/2013, 4:48 PM

## 2013-04-26 NOTE — ED Notes (Addendum)
Pt states that she has pain under her left breast that radiates to her left shoulder. Pt states today is different from the last two weeks due to dizziness, anxiety, and nausea and pain underneath her heart.

## 2013-04-27 ENCOUNTER — Observation Stay (HOSPITAL_COMMUNITY): Payer: Medicare Other

## 2013-04-27 LAB — GLUCOSE, CAPILLARY
Glucose-Capillary: 99 mg/dL (ref 70–99)
Glucose-Capillary: 107 mg/dL — ABNORMAL HIGH (ref 70–99)

## 2013-04-27 LAB — TSH: TSH: 0.929 u[IU]/mL (ref 0.350–4.500)

## 2013-04-27 MED ORDER — REGADENOSON 0.4 MG/5ML IV SOLN
INTRAVENOUS | Status: AC
  Administered 2013-04-27: 0.4 mg
  Filled 2013-04-27: qty 5

## 2013-04-27 MED ORDER — IBUPROFEN 600 MG PO TABS
600.0000 mg | ORAL_TABLET | Freq: Once | ORAL | Status: DC
  Filled 2013-04-27: qty 1

## 2013-04-27 MED ORDER — MECLIZINE HCL 12.5 MG PO TABS
12.5000 mg | ORAL_TABLET | Freq: Two times a day (BID) | ORAL | Status: DC
  Administered 2013-04-28: 12.5 mg via ORAL
  Filled 2013-04-27 (×3): qty 1

## 2013-04-27 MED ORDER — TECHNETIUM TC 99M SESTAMIBI - CARDIOLITE
30.0000 | Freq: Once | INTRAVENOUS | Status: AC | PRN
  Administered 2013-04-27: 30 via INTRAVENOUS

## 2013-04-27 MED ORDER — PANTOPRAZOLE SODIUM 40 MG PO TBEC
40.0000 mg | DELAYED_RELEASE_TABLET | Freq: Two times a day (BID) | ORAL | Status: DC
  Administered 2013-04-27 – 2013-04-30 (×7): 40 mg via ORAL
  Filled 2013-04-27 (×7): qty 1

## 2013-04-27 MED ORDER — MORPHINE SULFATE 2 MG/ML IJ SOLN
2.0000 mg | Freq: Once | INTRAMUSCULAR | Status: AC
  Administered 2013-04-27: 2 mg via INTRAVENOUS
  Filled 2013-04-27: qty 1

## 2013-04-27 MED ORDER — WHITE PETROLATUM GEL
Status: DC | PRN
  Filled 2013-04-27 (×2): qty 5

## 2013-04-27 MED ORDER — TECHNETIUM TC 99M SESTAMIBI GENERIC - CARDIOLITE
10.0000 | Freq: Once | INTRAVENOUS | Status: AC | PRN
  Administered 2013-04-27: 10 via INTRAVENOUS

## 2013-04-27 NOTE — Progress Notes (Addendum)
Subjective:  Pt reports no CP or SOB this morning. She has no further complaints.  Objective: Vital signs in last 24 hours: Filed Vitals:   04/27/13 1216 04/27/13 1217 04/27/13 1219 04/27/13 1221  BP: 141/78 154/62 135/63 121/71  Pulse: 66 107 110 101  Temp:      TempSrc:      Resp:      Height:      Weight:      SpO2:       Weight change:  No intake or output data in the 24 hours ending 04/27/13 1231  Physical Exam  Constitutional: She is oriented to person, place, and time and well-developed, well-nourished, and in no distress.  HENT:  Head: Normocephalic and atraumatic.  Eyes: Conjunctivae are normal.  Cardiovascular: Normal rate, regular rhythm, normal heart sounds and intact distal pulses.   Pulmonary/Chest: Effort normal and breath sounds normal. No respiratory distress.  Abdominal: Soft. Bowel sounds are normal. There is no tenderness.  Neurological: She is alert and oriented to person, place, and time.  Skin: Skin is warm and dry.    Lab Results: Basic Metabolic Panel:  Recent Labs Lab 04/26/13 0630 04/26/13 1659  NA 139  --   K 3.7  --   CL 100  --   CO2 29  --   GLUCOSE 101*  --   BUN 10  --   CREATININE 0.81 0.88  CALCIUM 9.5  --    CBC:  Recent Labs Lab 04/26/13 0630 04/26/13 1659  WBC 8.0 8.4  NEUTROABS 4.8  --   HGB 14.2 13.2  HCT 41.3 40.1  MCV 88.6 88.9  PLT 271 282   Cardiac Enzymes:  Recent Labs Lab 04/26/13 0630 04/26/13 1720  TROPONINI <0.30 <0.30   D-Dimer:  Recent Labs Lab 04/27/13 0930  DDIMER <0.27   CBG:  Recent Labs Lab 04/26/13 0703 04/26/13 1729 04/27/13 0750  GLUCAP 91 165* 99   Urine Drug Screen: Drugs of Abuse     Component Value Date/Time   LABOPIA NONE DETECTED 04/26/2013 1041   COCAINSCRNUR NONE DETECTED 04/26/2013 1041   LABBENZ NONE DETECTED 04/26/2013 1041   AMPHETMU NONE DETECTED 04/26/2013 1041   THCU NONE DETECTED 04/26/2013 1041   LABBARB NONE DETECTED 04/26/2013 1041    Alcohol  Level:  Recent Labs Lab 04/26/13 1230  ETH <11    Studies/Results: Dg Chest 2 View  04/26/2013   CLINICAL DATA:  Chest pain  EXAM: CHEST  2 VIEW  COMPARISON:  None.  FINDINGS: Thoracic spondylosis noted. Cardiac and mediastinal margins appear normal. The lungs appear clear. No pleural effusion identified.  IMPRESSION: 1. No acute radiographic findings. 2. Thoracic spondylosis.   Electronically Signed   By: Herbie Baltimore   On: 04/26/2013 07:07   Ct Head Wo Contrast  04/26/2013   CLINICAL DATA:  Dizziness. Anxiety. Nausea. Headache.  EXAM: CT HEAD WITHOUT CONTRAST  TECHNIQUE: Contiguous axial images were obtained from the base of the skull through the vertex without intravenous contrast.  COMPARISON:  None.  FINDINGS: The brainstem, cerebellum, cerebral peduncles, thalamus, basal ganglia, basilar cisterns, and ventricular system appear within normal limits. Periventricular white matter and corona radiata hypodensities favor chronic ischemic microvascular white matter disease. No intracranial hemorrhage, mass lesion, or acute CVA.  IMPRESSION: 1. Periventricular white matter and corona radiata hypodensities favor chronic ischemic microvascular white matter disease. 2. No acute intracranial findings.   Electronically Signed   By: Herbie Baltimore   On: 04/26/2013 08:14  Mr Brain Wo Contrast  04/26/2013   *RADIOLOGY REPORT*  Clinical Data: Dizziness and nausea.  Possible CVA.  MRI HEAD WITHOUT CONTRAST  Technique:  Multiplanar, multiecho pulse sequences of the brain and surrounding structures were obtained according to standard protocol without intravenous contrast.  Comparison: 04/26/2013 CT.  Findings:  There is no evidence for acute infarction, intracranial hemorrhage, mass lesion, hydrocephalus, or extra-axial fluid.  Mild atrophy.  Moderate chronic microvascular ischemic change affecting the periventricular and subcortical white matter.  No foci of chronic hemorrhage.  Normal midline structures.   Flow voids are maintained.  No osseous lesions.  Bilateral cataract extraction. No acute sinus or mastoid disease.  IMPRESSION: No acute intracranial findings.  Mild atrophy with moderate chronic microvascular ischemic change.   Original Report Authenticated By: Davonna Belling, M.D.   Medications: I have reviewed the patient's current medications. Scheduled Meds: . amLODipine  5 mg Oral Daily  . aspirin EC  325 mg Oral Daily  . atorvastatin  80 mg Oral Daily  . enoxaparin (LOVENOX) injection  40 mg Subcutaneous Q24H  . hydrochlorothiazide  12.5 mg Oral Daily  . irbesartan  300 mg Oral Daily  . levothyroxine  75 mcg Oral QAC breakfast  . oxybutynin  5 mg Oral Daily  . pantoprazole  40 mg Oral BID  . regadenoson  0.4 mg Intravenous Once   Continuous Infusions:  PRN Meds:.acetaminophen, gi cocktail, ondansetron (ZOFRAN) IV Assessment/Plan:  1. Atypical Chest Pain- Pt has been experiencing 2-3 weeks of intermittent substernal chest pain radiating into the left shoulder and under the breast. She also has associated SOB, nausea, dizziness, and HA. CXR reveals no acute findings but thoracic spondylosis. CT of head reveals periventricular white matter and corona radiata hypodensities which favor chronic ischemic white matter disease with no acute intracranial findings. MRI of brain reveals no acute intracranial findings but mild atrophy with chronic microvascular ischemic change. She does have risk factors for CAD and will work up as a ACS r/o. TIMI score: 3 which is a 13% risk at 14 days of all-cause mortality, new or recurrent MI, or severe recurrent ischemia requiring urgent revascularization. Wells Score: 1.5 which is low risk. Possible etiologies include: ACS, PE, Anxiety, GERD, Pneumothorax, Aortic Dissection. POCT was negative and serial troponins negative. She was placed on telemetry and continued on ASA 325mg  daily. TTE showed no abnormalities with a LVEF of 55-60%. Cardiology was consulted and will  perform lexiscan this morning. Ordered d-dimer due to h/o provoked PE and was negative. -await results of lexiscan -continue telemetry  -zofran PRN -continue ASA 325mg   -vestibular rehab for dizziness 2. DM2- d/c metformin  -SSI  3. Dyslipidemia- continue home meds  4. Hypothyroidism- continue home meds  -TSH pending 5. HTN- continue home meds  6. GERD-  -protonix bid   Dispo: Disposition is deferred at this time, awaiting improvement of current medical problems.  Anticipated discharge in approximately 1 day(s).   The patient does have a current PCP and does need an [facility] hospital follow-up appointment after discharge.  .Services Needed at time of discharge: Y = Yes, Blank = No PT:   OT:   RN:   Equipment:   Other:     LOS: 1 day   Boykin Peek, MD 04/27/2013, 12:31 PM

## 2013-04-27 NOTE — Progress Notes (Signed)
SUBJECTIVE:  No chest pain.  She has a headache   PHYSICAL EXAM Filed Vitals:   04/26/13 1330 04/26/13 1417 04/26/13 2036 04/27/13 0554  BP: 130/83 133/79 101/67 143/89  Pulse: 75 71  70  Temp:  98.4 F (36.9 C) 97.5 F (36.4 C) 97.6 F (36.4 C)  TempSrc:  Oral Oral Oral  Resp: 10 14  16   Height:  5\' 4"  (1.626 m)    Weight:  179 lb (81.194 kg)    SpO2: 97% 100% 97% 100%   General:  No distress Lungs:  Clear Heart:  RRR Abdomen:  Positive bowel sounds, no rebound no guarding Extremities:  No edema  LABS: Lab Results  Component Value Date   TROPONINI <0.30 04/26/2013   Results for orders placed during the hospital encounter of 04/26/13 (from the past 24 hour(s))  URINE RAPID DRUG SCREEN (HOSP PERFORMED)     Status: None   Collection Time    04/26/13 10:41 AM      Result Value Range   Opiates NONE DETECTED  NONE DETECTED   Cocaine NONE DETECTED  NONE DETECTED   Benzodiazepines NONE DETECTED  NONE DETECTED   Amphetamines NONE DETECTED  NONE DETECTED   Tetrahydrocannabinol NONE DETECTED  NONE DETECTED   Barbiturates NONE DETECTED  NONE DETECTED  ETHANOL     Status: None   Collection Time    04/26/13 12:30 PM      Result Value Range   Alcohol, Ethyl (B) <11  0 - 11 mg/dL  POCT I-STAT TROPONIN I     Status: None   Collection Time    04/26/13 12:47 PM      Result Value Range   Troponin i, poc 0.00  0.00 - 0.08 ng/mL   Comment 3           CBC     Status: None   Collection Time    04/26/13  4:59 PM      Result Value Range   WBC 8.4  4.0 - 10.5 K/uL   RBC 4.51  3.87 - 5.11 MIL/uL   Hemoglobin 13.2  12.0 - 15.0 g/dL   HCT 98.1  19.1 - 47.8 %   MCV 88.9  78.0 - 100.0 fL   MCH 29.3  26.0 - 34.0 pg   MCHC 32.9  30.0 - 36.0 g/dL   RDW 29.5  62.1 - 30.8 %   Platelets 282  150 - 400 K/uL  CREATININE, SERUM     Status: Abnormal   Collection Time    04/26/13  4:59 PM      Result Value Range   Creatinine, Ser 0.88  0.50 - 1.10 mg/dL   GFR calc non Af Amer 66 (*) >90  mL/min   GFR calc Af Amer 76 (*) >90 mL/min  TROPONIN I     Status: None   Collection Time    04/26/13  5:20 PM      Result Value Range   Troponin I <0.30  <0.30 ng/mL  GLUCOSE, CAPILLARY     Status: Abnormal   Collection Time    04/26/13  5:29 PM      Result Value Range   Glucose-Capillary 165 (*) 70 - 99 mg/dL  GLUCOSE, CAPILLARY     Status: None   Collection Time    04/27/13  7:50 AM      Result Value Range   Glucose-Capillary 99  70 - 99 mg/dL   Comment 1 Documented in Chart  Comment 2 Notify RN     No intake or output data in the 24 hours ending 04/27/13 1034   ASSESSMENT AND PLAN:  ATYPICAL CHEST PAIN:  Enzymes negative. Echo with normal EF and no significant valvular abnormalities.  Lexiscan Myoview results pending. OK to discharge from our standpoint if the images are negative.     Fayrene Fearing [facility] 04/27/2013 10:34 AM

## 2013-04-27 NOTE — Evaluation (Signed)
Physical Therapy Evaluation Patient Details Name: Katrina Swanson MRN: [id number] DOB: [birth year] Today's Date: 04/27/2013 Time: 4098-1191 PT Time Calculation (min): 48 min  PT Assessment / Plan / Recommendation History of Present Illness  Patient is a 67 yo female admitted with chest pain, dizziness, nausea, and headache.  Clinical Impression  Patient presents with problems listed below.  Completed vestibular evaluation.  Performed Modified Hallpike - patient tested positive for Rt posterior canal BPPV.  Performed Epley maneuver for canalith repositioning to treat Rt. BPPV.  Patient will benefit from acute PT to address Vestibular/balance issues.  Recommend OP PT for vestibular rehab program for continued therapy at discharge.    PT Assessment  Patient needs continued PT services    Follow Up Recommendations  Outpatient PT;Supervision for mobility/OOB (OP PT for Vestibular Rehab program)    Does the patient have the potential to tolerate intense rehabilitation      Barriers to Discharge        Equipment Recommendations  None recommended by PT    Recommendations for Other Services     Frequency Min 5X/week    Precautions / Restrictions Precautions Precautions: None Restrictions Weight Bearing Restrictions: No   Pertinent Vitals/Pain       Mobility  Bed Mobility Bed Mobility: Rolling Right;Rolling Left;Left Sidelying to Sit;Supine to Sit;Sit to Supine Rolling Right: 7: Independent Rolling Left: 7: Independent Left Sidelying to Sit: 7: Independent Supine to Sit: 7: Independent Sit to Supine: 7: Independent Details for Bed Mobility Assistance: No cues or assist needed Transfers Transfers: Sit to Stand;Stand to Sit Sit to Stand: 6: Modified independent (Device/Increase time);With upper extremity assist;From bed (increased time) Stand to Sit: 7: Independent;To bed Details for Transfer Assistance: No cues or physical assist needed.  Patient required increased time with  standing due to dizziness - needing to move more slowly. Ambulation/Gait Ambulation/Gait Assistance: 7: Independent Ambulation Distance (Feet): 200 Feet Assistive device: None Ambulation/Gait Assistance Details: Patient with good gait pattern, speed, and balance with basic gait. Gait Pattern: Step-through pattern Gait velocity: Slower gait speed    Exercises     PT Diagnosis: Acute pain;Abnormality of gait (Dizziness)  PT Problem List: Decreased activity tolerance;Decreased balance;Decreased mobility;Pain (Dizziness) PT Treatment Interventions: Gait training;Functional mobility training;Balance training;Patient/family education (Vestibular Rehab)     PT Goals(Current goals can be found in the care plan section) Acute Rehab PT Goals Patient Stated Goal: To go home soon PT Goal Formulation: With patient/family Time For Goal Achievement: 05/04/13 Potential to Achieve Goals: Good  Visit Information  Last PT Received On: 04/27/13 Assistance Needed: +1 History of Present Illness: Patient is a 68 yo female admitted with chest pain, dizziness, nausea, and headache.       Prior Functioning  Home Living Family/patient expects to be discharged to:: Private residence Living Arrangements: Spouse/significant other Available Help at Discharge: Family;Available 24 hours/day Type of Home: House Home Access: Stairs to enter [employer] of Steps: 1 Entrance Stairs-Rails: None Home Layout: One level Home Equipment: Walker - 4 wheels;Walker - standard;Cane - single point;Bedside commode Prior Function Level of Independence: Independent Communication Communication: No difficulties    Cognition  Cognition Arousal/Alertness: Awake/alert Behavior During Therapy: WFL for tasks assessed/performed Overall Cognitive Status: Within Functional Limits for tasks assessed    Extremity/Trunk Assessment Upper Extremity Assessment Upper Extremity Assessment: Overall WFL for tasks  assessed Lower Extremity Assessment Lower Extremity Assessment: Overall WFL for tasks assessed Cervical / Trunk Assessment Cervical / Trunk Assessment: Other exceptions (Decreased cervical ROM) Cervical / Trunk  Exceptions: Decreased cervical ROM; h/o back pain   Balance Balance Balance Assessed: Yes High Level Balance High Level Balance Activites: Direction changes;Turns;Sudden stops;Head turns (Walking around and stepping over obstacles, change speed) High Level Balance Comments: Patient with increase in dizziness/nausea when looking down, with quick turns.  Patient had to stop and hold onto rail in hallway.  End of Session PT - End of Session Equipment Utilized During Treatment: Gait belt Activity Tolerance: Patient limited by pain (Dizziness) Patient left: in chair;with call bell/phone within reach;with family/visitor present (with HOB elevated) Nurse Communication: Mobility status;Patient requests pain meds  GP Functional Assessment Tool Used: Clinical judgement Functional Limitation: Mobility: Walking and moving around Mobility: Walking and Moving Around Current Status [phone]): At least 1 percent but less than 20 percent impaired, limited or restricted Mobility: Walking and Moving Around Goal Status [phone]): 0 percent impaired, limited or restricted   Vena Austria 04/27/2013, 4:00 PM Durenda Hurt. Renaldo Fiddler, [facility] Acute Rehab Services Pager [phone]

## 2013-04-27 NOTE — Discharge Summary (Signed)
Name: Katrina Swanson MRN: [id number] DOB: 07-03-1946 67 y.o. PCP: No Pcp Per Patient  Date of Admission: 04/26/2013  6:08 AM Date of Discharge: 04/30/2013 Attending Physician: Aletta Edouard, MD  Discharge Diagnosis: Principal Problem:   Abnormal cardiovascular stress test Active Problems:   Unspecified hypothyroidism   Essential hypertension, benign   BPPV (benign paroxysmal positional vertigo)  Discharge Medications:   Medication List    STOP taking these medications       ranitidine 150 MG tablet  Commonly known as:  ZANTAC      TAKE these medications       aspirin 325 MG tablet  Take 325 mg by mouth daily.     atorvastatin 80 MG tablet  Commonly known as:  LIPITOR  Take 80 mg by mouth daily.     levothyroxine 75 MCG tablet  Commonly known as:  SYNTHROID, LEVOTHROID  Take 75 mcg by mouth daily before breakfast.     metFORMIN 500 MG tablet  Commonly known as:  GLUCOPHAGE  Take 500 mg by mouth 2 (two) times daily with a meal.     nitroGLYCERIN 0.4 MG SL tablet  Commonly known as:  NITROSTAT  Place 0.4 mg under the tongue every 5 (five) minutes as needed for chest pain.     oxybutynin 5 MG 24 hr tablet  Commonly known as:  DITROPAN-XL  Take 5 mg by mouth daily.     pantoprazole 40 MG tablet  Commonly known as:  PROTONIX  Take 40 mg by mouth daily.     TRIBENZOR 40-5-12.5 MG Tabs  Generic drug:  Olmesartan-Amlodipine-HCTZ  Take 1 tablet by mouth daily.        Disposition and follow-up:   Katrina Swanson was discharged from [facility] in Good condition.  At the hospital follow up visit please address:  1.  Continued chest pain, dizziness  2.  Labs / imaging needed at time of follow-up: None  3.  Pending labs/ test needing follow-up: None  Follow-up Appointments: Follow-up Information   Follow up with Bedelia Person, MD On 05/06/2013. (hospital follow-up 10:45am (Nurse))    Specialty:  Internal Medicine   Contact  information:   [address] [place] [address] [phone]       Follow up with Charlton Haws, MD On 05/13/2013. (2:45p Cardiology hospital follow-up)    Specialty:  Cardiology   Contact information:   [address] [place] [address] [phone]       Discharge Instructions: Discharge Orders   Future Appointments Provider Department Dept Phone   05/13/2013 2:45 PM Wendall Stade, MD [facility] Office [phone]   Future Orders Complete By Expires   Call MD for:  persistant dizziness or light-headedness  As directed    Diet - low sodium heart healthy  As directed    Increase activity slowly  As directed       Consultations:    Procedures Performed:  Dg Chest 2 View  04/26/2013   CLINICAL DATA:  Chest pain  EXAM: CHEST  2 VIEW  COMPARISON:  None.  FINDINGS: Thoracic spondylosis noted. Cardiac and mediastinal margins appear normal. The lungs appear clear. No pleural effusion identified.  IMPRESSION: 1. No acute radiographic findings. 2. Thoracic spondylosis.   Electronically Signed   By: Herbie Baltimore   On: 04/26/2013 07:07   Ct Head Wo Contrast  04/26/2013   CLINICAL DATA:  Dizziness. Anxiety. Nausea. Headache.  EXAM: CT  HEAD WITHOUT CONTRAST  TECHNIQUE: Contiguous axial images were obtained from the base of the skull through the vertex without intravenous contrast.  COMPARISON:  None.  FINDINGS: The brainstem, cerebellum, cerebral peduncles, thalamus, basal ganglia, basilar cisterns, and ventricular system appear within normal limits. Periventricular white matter and corona radiata hypodensities favor chronic ischemic microvascular white matter disease. No intracranial hemorrhage, mass lesion, or acute CVA.  IMPRESSION: 1. Periventricular white matter and corona radiata hypodensities favor chronic ischemic microvascular white matter disease. 2. No acute intracranial findings.   Electronically Signed   By: Herbie Baltimore   On: 04/26/2013 08:14   Mr Brain Wo Contrast  04/26/2013   *RADIOLOGY REPORT*  Clinical Data: Dizziness and nausea.  Possible CVA.  MRI HEAD WITHOUT CONTRAST  Technique:  Multiplanar, multiecho pulse sequences of the brain and surrounding structures were obtained according to standard protocol without intravenous contrast.  Comparison: 04/26/2013 CT.  Findings:  There is no evidence for acute infarction, intracranial hemorrhage, mass lesion, hydrocephalus, or extra-axial fluid.  Mild atrophy.  Moderate chronic microvascular ischemic change affecting the periventricular and subcortical white matter.  No foci of chronic hemorrhage.  Normal midline structures.  Flow voids are maintained.  No osseous lesions.  Bilateral cataract extraction. No acute sinus or mastoid disease.  IMPRESSION: No acute intracranial findings.  Mild atrophy with moderate chronic microvascular ischemic change.   Original Report Authenticated By: Davonna Belling, M.D.    2D Echo: 04/26/13: Systolic function normal with LVEF 55-60% with grade 1 diastolic dysfunction.  Cardiac Cath: 04/29/13: No significant CAD and normal LV function of 55-65%.   Admission HPI: Katrina Swanson is a 67 yo AAF from [place], [place] with a PMH of HTN, DM2 (pt reports last HA1C 6.1), dyslipidemia, hypothyroidism, GERD, and CVA (2012). She presents to the ED with 2-3 weeks of "on and off" substernal chest pain with radiation into her left shoulder and under her left breast. She describes the pain as "dull like anxiety" and lasts about 20-30 minutes at a time. She endorses some associated HA, SOB, nausea (no emesis), and dizziness. She reports the pain is mainly nonexertional and happens mainly after she "has eaten too much" or when she lies down. She has dyspepsia at times but not as long as she takes her protonix and zantac. She denies any new exercises or trauma to the chest, however she does state that she sometimes works in her flower garden and has  some associated chest pain but this feels different to her. She states the pain is non-tender to palpation and not associated with inspiration or changes in position. She denies any episodes of similar pain in the past. 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  Of note, she reportedly saw her PCP for these issues who told her she needed a MRI to r/o a new stroke. Therefore, she was initially worked up as a possible CVA.    Hospital Course by problem list: 1. Atypical Chest Pain- Pt had been experiencing 2-3 weeks of intermittent substernal chest pain radiating into the left shoulder and under the breast. She had associated SOB, nausea, dizziness, and HA. CXR revealed no acute findings but thoracic spondylosis. CT  of head revealed periventricular white matter and corona radiata hypodensities which favor chronic ischemic white matter disease with no acute intracranial findings. MRI of brain revealed no acute intracranial findings but mild atrophy with chronic microvascular ischemic change. She has risk factors for CAD and worked up as an ACS r/o. TIMI score: 3 which is a 13% risk at 14 days of all-cause mortality, new or recurrent MI, or severe recurrent ischemia requiring urgent revascularization. Wells Score: 1.5 which is low risk. Possible etiologies considered: ACS, PE, Anxiety, GERD, Pneumothorax, Aortic Dissection. POCT was negative and serial troponins negative. She was placed on telemetry and continued on ASA 325mg  daily and statin. D-dimer was also negative. TTE showed no abnormalities with a LVEF of 55-60%. Cardiology was consulted and performed lexiscan which revealed 2 small areas of inducible ischemia. LHC yesterday revealed no  significant CAD. No further recommendations by cardiology. Upon discharge, she was stable and no further episodes of chest pain.  2. DM2- metformin d/c upon admission; placed on SSI; blood sugars stable throughout admission.  3. Dyslipidemia- continued home meds  4. Hypothyroidism- continued home meds; TSH normal at 0.929. 5. HTN- continued home meds  6. GERD- d/c zantac and placed on protonix bid.  Discharge Vitals:   BP 122/56  Pulse 67  Temp(Src) 97.9 F (36.6 C) (Oral)  Resp 18  Ht 5\' 4"  (1.626 m)  Wt 81.194 kg (179 lb)  BMI 30.71 kg/m2  SpO2 98%  Discharge Labs:  Results for orders placed during the hospital encounter of 04/26/13 (from the past 24 hour(s))  PROTIME-INR     Status: None   Collection Time    04/29/13 11:25 AM      Result Value Range   Prothrombin Time 12.4  11.6 - 15.2 seconds   INR 0.94  0.00 - 1.49  GLUCOSE, CAPILLARY     Status: None   Collection Time    04/29/13 11:27 AM      Result Value Range   Glucose-Capillary 78  70 - 99 mg/dL   Comment 1 Documented in Chart     Comment 2 Notify RN    GLUCOSE, CAPILLARY     Status: Abnormal   Collection Time    04/29/13 12:38 PM      Result Value Range   Glucose-Capillary 142 (*) 70 - 99 mg/dL   Comment 1 Documented in Chart     Comment 2 Notify RN    GLUCOSE, CAPILLARY     Status: None   Collection Time    04/29/13  3:40 PM      Result Value Range   Glucose-Capillary 74  70 - 99 mg/dL   Comment 1 Documented in Chart     Comment 2 Notify RN    GLUCOSE, CAPILLARY     Status: Abnormal   Collection Time    04/29/13  8:55 PM      Result Value Range   Glucose-Capillary 157 (*) 70 - 99 mg/dL   Comment 1 Notify RN    GLUCOSE, CAPILLARY     Status: None   Collection Time    04/30/13  7:28 AM      Result Value Range   Glucose-Capillary 98  70 - 99 mg/dL   Comment 1 Documented in Chart     Comment 2 Notify RN      Signed: Boykin Peek, MD 04/30/2013, 10:53 AM   Time Spent on Discharge: 45  minutes Services Ordered on Discharge: None Equipment Ordered on Discharge: None

## 2013-04-27 NOTE — Progress Notes (Signed)
UR Completed.  Katrina Swanson Jane [phone] 04/27/2013  

## 2013-04-27 NOTE — H&P (Addendum)
I saw and evaluated the patient.  I personally confirmed the key portions of the history and exam documented by Dr. Delane Ginger and I reviewed pertinent patient test results.  The assessment, diagnosis, and plan were formulated together and I agree with the documentation in the resident's note.   This is a 67 year old lady with past medical issues hypertension, diabetes type 2, dyslipidemia, hypothyroidism, GERD, PE on Premarin and CVA in 2012. She has her care providers in [place] and is visiting [place]. She comes to the ED because she was having ongoing complaints of dizziness and chest pain and she equated them to stroke.  Exam does not show any focal neurologic deficit. She denies chest pain or shortness of breath at present. She has already been ruled out of stroke via a negative brain MRI. She has been ruled out of any acute coronary event by the cardiac work up. Cardiology has seen her and obtained a 2D echo which did not show any wall motion abnormality. She has also been started on GI cocktail and protonix twice daily to counter any GERD related pain. We will continue to observe the patient on cardiac telemetry for today. In addition we will obtain - orthostatic vitals for dizziness, and await stress test results that has been ordered by cardiology.    04/27/2013 3 pm: Addendum:   Nuclear Stress test has 2 foci of inducible ischemia. I discussed the findings with Dr. Charlsie Merles who is the covering resident and she agrees to call cardiology and notify them urgently. We will await cardiology response for next plans for the patient. Dr. Sherrine Maples will make patient aware.

## 2013-04-27 NOTE — Progress Notes (Signed)
Progress Note   Subjective:  Denies CP or dyspnea   Objective:  Filed Vitals:   04/26/13 1330 04/26/13 1417 04/26/13 2036 04/27/13 0554  BP: 130/83 133/79 101/67 143/89  Pulse: 75 71  70  Temp:  98.4 F (36.9 C) 97.5 F (36.4 C) 97.6 F (36.4 C)  TempSrc:  Oral Oral Oral  Resp: 10 14  16   Height:  5\' 4"  (1.626 m)    Weight:  179 lb (81.194 kg)    SpO2: 97% 100% 97% 100%    Intake/Output from previous day: No intake or output data in the 24 hours ending 04/27/13 1208  PHYSICAL EXAM: No acute distress Neck: no JVD Cardiac:  normal S1, S2; RRR; no murmur Lungs:  clear to auscultation bilaterally, no wheezing, rhonchi or rales Abd: soft, nontender, no hepatomegaly Ext: no edema Skin: warm and dry Neuro:  CNs 2-12 intact, no focal abnormalities noted   Lab Results:  Basic Metabolic Panel:  Recent Labs  09/81/19 0630 04/26/13 1659  NA 139  --   K 3.7  --   CL 100  --   CO2 29  --   GLUCOSE 101*  --   BUN 10  --   CREATININE 0.81 0.88  CALCIUM 9.5  --     CBC:  Recent Labs  04/26/13 0630 04/26/13 1659  WBC 8.0 8.4  NEUTROABS 4.8  --   HGB 14.2 13.2  HCT 41.3 40.1  MCV 88.6 88.9  PLT 271 282    Cardiac Enzymes:  Recent Labs  04/26/13 0630 04/26/13 1720  TROPONINI <0.30 <0.30     Assessment/Plan:   1. Chest Pain:  MI r/o.  Lexiscan Myoview performed today:  She tolerated the procedure.  She c/o nausea and lightheadedness with injection.  No significant ECG changes.  Images pending.  Tereso Newcomer, PA-C   04/27/2013 12:08 PM    Pager # [phone]

## 2013-04-28 DIAGNOSIS — R9439 Abnormal result of other cardiovascular function study: Secondary | ICD-10-CM | POA: Diagnosis not present

## 2013-04-28 DIAGNOSIS — H8110 Benign paroxysmal vertigo, unspecified ear: Secondary | ICD-10-CM | POA: Diagnosis present

## 2013-04-28 DIAGNOSIS — E039 Hypothyroidism, unspecified: Secondary | ICD-10-CM | POA: Diagnosis present

## 2013-04-28 DIAGNOSIS — E119 Type 2 diabetes mellitus without complications: Secondary | ICD-10-CM | POA: Diagnosis present

## 2013-04-28 DIAGNOSIS — I10 Essential (primary) hypertension: Secondary | ICD-10-CM | POA: Diagnosis present

## 2013-04-28 LAB — GLUCOSE, CAPILLARY
Glucose-Capillary: 107 mg/dL — ABNORMAL HIGH (ref 70–99)
Glucose-Capillary: 139 mg/dL — ABNORMAL HIGH (ref 70–99)
Glucose-Capillary: 172 mg/dL — ABNORMAL HIGH (ref 70–99)

## 2013-04-28 MED ORDER — BISACODYL 10 MG RE SUPP
10.0000 mg | Freq: Once | RECTAL | Status: AC
  Administered 2013-04-28: 10 mg via RECTAL
  Filled 2013-04-28: qty 1

## 2013-04-28 MED ORDER — SODIUM CHLORIDE 0.9 % IV SOLN: 250.0000 mL | INTRAVENOUS | Status: DC | PRN

## 2013-04-28 MED ORDER — SORBITOL 70 % SOLN
50.0000 mL | Freq: Two times a day (BID) | Status: DC
  Administered 2013-04-28: 50 mL via ORAL
  Filled 2013-04-28 (×5): qty 60

## 2013-04-28 MED ORDER — SODIUM CHLORIDE 0.9 % IJ SOLN
3.0000 mL | Freq: Two times a day (BID) | INTRAMUSCULAR | Status: DC
  Administered 2013-04-28: 3 mL via INTRAVENOUS

## 2013-04-28 MED ORDER — IBUPROFEN 400 MG PO TABS
400.0000 mg | ORAL_TABLET | Freq: Four times a day (QID) | ORAL | Status: DC | PRN
Start: 2013-04-28 — End: 2013-04-30
  Administered 2013-04-28: 400 mg via ORAL
  Filled 2013-04-28: qty 1

## 2013-04-28 MED ORDER — SODIUM CHLORIDE 0.9 % IV SOLN
1.0000 mL/kg/h | INTRAVENOUS | Status: DC
  Administered 2013-04-29: 1 mL/kg/h via INTRAVENOUS

## 2013-04-28 MED ORDER — SODIUM CHLORIDE 0.9 % IJ SOLN: 3.0000 mL | INTRAMUSCULAR | Status: DC | PRN

## 2013-04-28 NOTE — ED Provider Notes (Signed)
History/physical exam/procedure(s) were performed by non-physician practitioner and as supervising physician I was immediately available for consultation/collaboration. I have reviewed all notes and am in agreement with care and plan.   Madoc Holquin S Yariah Selvey, MD 04/28/13 1624 

## 2013-04-28 NOTE — Progress Notes (Signed)
Physical Therapy Treatment Patient Details Name: Edell Mesenbrink MRN: [id number] DOB: 06-15-1946 Today's Date: 04/28/2013 Time: 1357-1410 PT Time Calculation (min): 13 min  PT Assessment / Plan / Recommendation  History of Present Illness Patient is a 67 yo female admitted with chest pain, dizziness, nausea, and headache.   PT Comments   Patient much improved today.  Only one episode of "dizziness" that resolved on its own.  Reviewed instructions for managing dizzy episodes including using meds as prescribed by MD, and connecting to OP PT program when appropriate.  Patient for cath tomorrow.  Will continue to follow for any needs prior to discharge.  Follow Up Recommendations  Outpatient PT;Supervision - Intermittent (OP PT for Vestibular Rehab programs )     Does the patient have the potential to tolerate intense rehabilitation     Barriers to Discharge        Equipment Recommendations  None recommended by PT    Recommendations for Other Services    Frequency Min 5X/week   Progress towards PT Goals Progress towards PT goals: Progressing toward goals  Plan Current plan remains appropriate    Precautions / Restrictions Precautions Precautions: None Restrictions Weight Bearing Restrictions: No   Pertinent Vitals/Pain     Mobility  Ambulation/Gait Ambulation/Gait Assistance: 7: Independent Ambulation Distance (Feet): 200 Feet Assistive device: None Ambulation/Gait Assistance Details: Patient ambulating independently with good balance. Gait Pattern: Within Functional Limits      PT Goals (current goals can now be found in the care plan section)    Visit Information  Last PT Received On: 04/28/13 Assistance Needed: +1 History of Present Illness: Patient is a 67 yo female admitted with chest pain, dizziness, nausea, and headache.    Subjective Data  Subjective: I'm feeling much better today.   Cognition  Cognition Arousal/Alertness: Awake/alert Behavior During  Therapy: WFL for tasks assessed/performed Overall Cognitive Status: Within Functional Limits for tasks assessed    Balance     End of Session PT - End of Session Activity Tolerance: Patient tolerated treatment well Patient left: in chair;with call bell/phone within reach   GP     Vena Austria 04/28/2013, 2:19 PM Durenda Hurt. Renaldo Fiddler, [facility] Acute Rehab Services Pager [phone]

## 2013-04-28 NOTE — Progress Notes (Signed)
   SUBJECTIVE:  No chest pain.    PHYSICAL EXAM Filed Vitals:   04/27/13 1221 04/27/13 1700 04/27/13 2100 04/28/13 0500  BP: 121/71 126/76 122/98 97/63  Pulse: 101 74 69 79  Temp:  97.8 F (36.6 C) 98.4 F (36.9 C) 98.1 F (36.7 C)  TempSrc:  Oral    Resp:   18 18  Height:      Weight:      SpO2:   100% 98%   General:  No distress Lungs:  Clear Heart:  RRR Abdomen:  Positive bowel sounds, no rebound no guarding Extremities:  No edema  LABS: Lab Results  Component Value Date   TROPONINI <0.30 04/26/2013   Results for orders placed during the hospital encounter of 04/26/13 (from the past 24 hour(s))  GLUCOSE, CAPILLARY     Status: Abnormal   Collection Time    04/27/13  4:21 PM      Result Value Range   Glucose-Capillary 165 (*) 70 - 99 mg/dL   Comment 1 Documented in Chart     Comment 2 Notify RN    GLUCOSE, CAPILLARY     Status: Abnormal   Collection Time    04/27/13  8:38 PM      Result Value Range   Glucose-Capillary 107 (*) 70 - 99 mg/dL  GLUCOSE, CAPILLARY     Status: Abnormal   Collection Time    04/28/13  7:45 AM      Result Value Range   Glucose-Capillary 107 (*) 70 - 99 mg/dL   Comment 1 Documented in Chart     Comment 2 Notify RN      Intake/Output Summary (Last 24 hours) at 04/28/13 1005 Last data filed at 04/27/13 2200  Gross per 24 hour  Intake    120 ml  Output      0 ml  Net    120 ml   Lexiscan Myoview:  1. Positive for two small areas of inducible ischemia, one in the  inferior mid ventricle and the second in the anteroseptal mid  ventricle extending to the apex.  2. Normal cardiac wall motion.  3. Calculated ejection fraction of 74%.    ASSESSMENT AND PLAN:  ATYPICAL CHEST PAIN:  Enzymes negative. Echo with normal EF and no significant valvular abnormalities.   Given the fact that she has had resting chest pain and the results above cath is indicated. The patient understands that risks included but are not limited to stroke (1 in  1000), death (1 in 1000), kidney failure [usually temporary] (1 in 500), bleeding (1 in 200), allergic reaction [possibly serious] (1 in 200).  The patient understands and agrees to proceed.   I will put her on the board for tomorrow.     Fayrene Fearing [facility] 04/28/2013 10:05 AM

## 2013-04-28 NOTE — Progress Notes (Signed)
Subjective: The patient is doing well this morning. She is no longer having headache and dizziness and thinks that the therapy really helped her. She is not having any chest pain. She tolerated the stress test yesterday and thinks that it was much better than ones she has had in the past. She denies any SOB or nausea or vomiting. She understands about the stress test and is glad that she had it done.  Objective: Vital signs in last 24 hours: Filed Vitals:   04/27/13 1221 04/27/13 1700 04/27/13 2100 04/28/13 0500  BP: 121/71 126/76 122/98 97/63  Pulse: 101 74 69 79  Temp:  97.8 F (36.6 C) 98.4 F (36.9 C) 98.1 F (36.7 C)  TempSrc:  Oral    Resp:   18 18  Height:      Weight:      SpO2:   100% 98%   Weight change:   Intake/Output Summary (Last 24 hours) at 04/28/13 1101 Last data filed at 04/27/13 2200  Gross per 24 hour  Intake    120 ml  Output      0 ml  Net    120 ml   General: resting in bed, pleasant, talking on the phone to a friend when I enter HEENT: PERRL, EOMI, no scleral icterus Cardiac: RRR, no rubs, murmurs or gallops Pulm: clear to auscultation bilaterally, moving normal volumes of air Abd: soft, nontender, nondistended, BS present Ext: warm and well perfused, no pedal edema Neuro: alert and oriented X3, cranial nerves II-XII grossly intact  Lab Results: Basic Metabolic Panel:  Recent Labs Lab 04/26/13 0630 04/26/13 1659  NA 139  --   K 3.7  --   CL 100  --   CO2 29  --   GLUCOSE 101*  --   BUN 10  --   CREATININE 0.81 0.88  CALCIUM 9.5  --    CBC:  Recent Labs Lab 04/26/13 0630 04/26/13 1659  WBC 8.0 8.4  NEUTROABS 4.8  --   HGB 14.2 13.2  HCT 41.3 40.1  MCV 88.6 88.9  PLT 271 282   Cardiac Enzymes:  Recent Labs Lab 04/26/13 0630 04/26/13 1720  TROPONINI <0.30 <0.30  D-Dimer:  Recent Labs Lab 04/27/13 0930  DDIMER <0.27   CBG:  Recent Labs Lab 04/26/13 0703 04/26/13 1729 04/27/13 0750 04/27/13 1621  04/27/13 2038 04/28/13 0745  GLUCAP 91 165* 99 165* 107* 107*   Thyroid Function Tests:  Recent Labs Lab 04/27/13 0930  TSH 0.929   Studies/Results: Nm Myocar Multi W/spect W/wall Motion / Ef  04/27/2013   *RADIOLOGY REPORT*  Clinical Data:  Chest pain  MYOCARDIAL IMAGING WITH SPECT (REST AND PHARMACOLOGIC-STRESS) GATED LEFT VENTRICULAR WALL MOTION STUDY LEFT VENTRICULAR EJECTION FRACTION  Technique:  Standard myocardial SPECT imaging was performed after resting intravenous injection of 10 mCi Tc-5m sestamibi. Subsequently, intravenous infusion of regadenoson was performed under the supervision of the Cardiology staff.  At peak effect of the drug, 30 mCi Tc-77m sestamibi was injected intravenously and standard myocardial SPECT  imaging was performed.  Quantitative gated imaging was also performed to evaluate left ventricular wall motion, and estimate left ventricular ejection fraction.  Comparison:  Chest x-ray 04/26/2013  Findings:  Evaluation of cardiac gated data demonstrates an end-diastolic volume of 58 ml and an end-systolic volume of 15 ml yielding a calculated ejection fraction of 74%.  No evidence of focal wall motion abnormality.  Adequate radiotracer uptake throughout the ventricular myocardium at rest.  However, on  the following administration of Lexiscan there are two small foci of relatively decreased radiotracer uptake, one in the inferior wall of the mid ventricle and the seconds in the anteroseptal wall of the mid ventricle extending to the apex.  Both foci are concerning for areas of inducible ischemia.  IMPRESSION:  1.  Positive for two small areas of inducible ischemia, one in the inferior mid ventricle and the second in the anteroseptal mid ventricle extending to the apex. 2.  Normal cardiac wall motion. 3.  Calculated ejection fraction of 74%.   Original Report Authenticated By: Malachy Moan, M.D.   Medications: I have reviewed the patient's current medications. Scheduled  Meds: . amLODipine  5 mg Oral Daily  . aspirin EC  325 mg Oral Daily  . atorvastatin  80 mg Oral Daily  . enoxaparin (LOVENOX) injection  40 mg Subcutaneous Q24H  . hydrochlorothiazide  12.5 mg Oral Daily  . ibuprofen  600 mg Oral Once  . irbesartan  300 mg Oral Daily  . levothyroxine  75 mcg Oral QAC breakfast  . meclizine  12.5 mg Oral BID  . oxybutynin  5 mg Oral Daily  . pantoprazole  40 mg Oral BID  . regadenoson  0.4 mg Intravenous Once   Continuous Infusions:  PRN Meds:.acetaminophen, gi cocktail, ondansetron (ZOFRAN) IV, white petrolatum Assessment/Plan:  Abnormal cardiovascular stress test with chest pain - Per cardiology would proceed with catheterization on Monday. Patient has multiple risk factors and would likely benefit. Chest pain has resolved since and has not had since admission. Blood pressures are stable and 97/63 today without any dizziness or lightheadedness.  -Cath Monday, NPO after midnight -ASA, statin daily  Unspecified hypothyroidism - Continue home levothyroxine. TSH normal.  Essential hypertension, benign - Blood pressure stable on home regimen of irbesartan, HCTZ, amlodipine. May hold some medications before catheterization while NPO in the AM.   BPPV (benign paroxysmal positional vertigo) - Will continue with vestibular therapy and she has not had any dizziness today. Headache is resolved as well. Will continue to monitor.  -Will need Rx for out-patient vestibular rehab  DVT ppx - lovenox [place] daily  Dispo: Disposition is deferred at this time, awaiting improvement of current medical problems.  Anticipated discharge in approximately 2-3 day(s).   The patient does not have a current PCP (No Pcp Per Patient) and does need an [facility] hospital follow-up appointment after discharge.  The patient does not have transportation limitations that hinder transportation to clinic appointments.  .Services Needed at time of discharge: Y = Yes, Blank = No PT:   OT:    RN:   Equipment:   Other:     LOS: 2 days   Judie Bonus, MD 04/28/2013, 11:01 AM

## 2013-04-28 NOTE — Progress Notes (Signed)
Utilization Review completed.  

## 2013-04-29 ENCOUNTER — Encounter (HOSPITAL_COMMUNITY)
Admission: EM | Disposition: A | Discharge status: Home / Self Care | Payer: Self-pay | Source: Home / Self Care | Attending: Internal Medicine

## 2013-04-29 DIAGNOSIS — R079 Chest pain, unspecified: Secondary | ICD-10-CM

## 2013-04-29 DIAGNOSIS — I10 Essential (primary) hypertension: Secondary | ICD-10-CM

## 2013-04-29 HISTORY — PX: LEFT HEART CATHETERIZATION WITH CORONARY ANGIOGRAM: SHX5451

## 2013-04-29 LAB — PROTIME-INR
INR: 0.94 (ref 0.00–1.49)
Prothrombin Time: 12.4 seconds (ref 11.6–15.2)

## 2013-04-29 LAB — GLUCOSE, CAPILLARY
Glucose-Capillary: 78 mg/dL (ref 70–99)
Glucose-Capillary: 74 mg/dL (ref 70–99)
Glucose-Capillary: 157 mg/dL — ABNORMAL HIGH (ref 70–99)

## 2013-04-29 LAB — BASIC METABOLIC PANEL
BUN: 15 mg/dL (ref 6–23)
CO2: 28 mEq/L (ref 19–32)
Calcium: 9.1 mg/dL (ref 8.4–10.5)
GFR calc non Af Amer: 52 mL/min — ABNORMAL LOW (ref >90)
Glucose, Bld: 110 mg/dL — ABNORMAL HIGH (ref 70–99)

## 2013-04-29 SURGERY — LEFT HEART CATHETERIZATION WITH CORONARY ANGIOGRAM
Anesthesia: LOCAL

## 2013-04-29 MED ORDER — VERAPAMIL HCL 2.5 MG/ML IV SOLN
INTRAVENOUS | Status: AC
  Filled 2013-04-29: qty 2

## 2013-04-29 MED ORDER — HEPARIN (PORCINE) IN NACL 2-0.9 UNIT/ML-% IJ SOLN
INTRAMUSCULAR | Status: AC
  Filled 2013-04-29: qty 1000

## 2013-04-29 MED ORDER — NITROGLYCERIN 0.2 MG/ML ON CALL CATH LAB
INTRAVENOUS | Status: AC
  Filled 2013-04-29: qty 1

## 2013-04-29 MED ORDER — GLUCOSE 40 % PO GEL
ORAL | Status: AC
  Administered 2013-04-29: 17:00:00
  Filled 2013-04-29: qty 1

## 2013-04-29 MED ORDER — HEPARIN SODIUM (PORCINE) 1000 UNIT/ML IJ SOLN
INTRAMUSCULAR | Status: AC
  Filled 2013-04-29: qty 1

## 2013-04-29 MED ORDER — DEXTROSE 50 % IV SOLN
INTRAVENOUS | Status: AC
  Administered 2013-04-29: 50 mL
  Filled 2013-04-29: qty 50

## 2013-04-29 MED ORDER — MIDAZOLAM HCL 2 MG/2ML IJ SOLN
INTRAMUSCULAR | Status: AC
  Filled 2013-04-29: qty 2

## 2013-04-29 MED ORDER — SODIUM CHLORIDE 0.9 % IV SOLN: 1.0000 mL/kg/h | INTRAVENOUS | Status: AC

## 2013-04-29 MED ORDER — FENTANYL CITRATE 0.05 MG/ML IJ SOLN
INTRAMUSCULAR | Status: AC
  Filled 2013-04-29: qty 2

## 2013-04-29 MED ORDER — LIDOCAINE HCL (PF) 1 % IJ SOLN
INTRAMUSCULAR | Status: AC
  Filled 2013-04-29: qty 30

## 2013-04-29 NOTE — Interval H&P Note (Signed)
History and Physical Interval Note:  04/29/2013 4:44 PM  Katrina Swanson  has presented today for surgery, with the diagnosis of cp  The various methods of treatment have been discussed with the patient and family. After consideration of risks, benefits and other options for treatment, the patient has consented to  Procedure(s): LEFT HEART CATHETERIZATION WITH CORONARY ANGIOGRAM (N/A) as a surgical intervention .  The patient's history has been reviewed, patient examined, no change in status, stable for surgery.  I have reviewed the patient's chart and labs.  Questions were answered to the patient's satisfaction.   Cath Lab Visit (complete for each Cath Lab visit)  Clinical Evaluation Leading to the Procedure:   ACS: yes  Non-ACS:    Anginal Classification: CCS III  Anti-ischemic medical therapy: Minimal Therapy (1 class of medications)  Non-Invasive Test Results: Intermediate-risk stress test findings: cardiac mortality 1-3%/year  Prior CABG: No previous CABG        Theron Arista [facility] 04/29/2013 4:44 PM

## 2013-04-29 NOTE — Progress Notes (Signed)
 CONSULT NOTE  Date: 04/29/2013               Patient Name:  Katrina Swanson MRN: [id number]  DOB: 05/07/1946 Age / Sex: 67 y.o., female        PCP: No PCP Per Patient Primary Cardiologist: New to [facility]            Referring Physician: Dr. Mullen              Reason for Consult:  CAD, Chest pain           History of Present Illness: Patient is a 67 y.o. female with a PMHx of cerebral vascular disease, who was admitted to [facility] on 04/26/2013 for evaluation of multiple symptoms of lightheadness, orthostatis, vertigo, stumbling, chest pain.   The CP has been present for several weeks.  She had 1 particularly bad night  ~ 2 week ago during which she took some NTG.   Since that time, she has had frequent episodes of CP.  Not associated with exercise ( does not exercise) .    The pain is a tight sensation - no dyspnea and is not associated with weakness or dizziness.  Working around the house does not cause the CP.   These last for several minutes, radiate to her left arm.  She has taked NTG on several occasions.   She was scheduled to see Dr. Nishan but could not wait until the apt because of worsening weakness and loss of balance.   Her main complaint is that of progressive weakness and dizziness.  This has also been going on for 2 weeks.  She had a hx of CVA 2 years and her medical doctor thought that she may have had another one.    She had a myoview which revealed possible ischemia of the mid / distal anterior wall and mid inferior wall.  She is scheduled for cath this am.    Medications: Outpatient medications: Prescriptions prior to admission  Medication Sig Dispense Refill  . aspirin 325 MG tablet Take 325 mg by mouth daily.      . atorvastatin (LIPITOR) 80 MG tablet Take 80 mg by mouth daily.      . levothyroxine (SYNTHROID, LEVOTHROID) 75 MCG tablet Take 75 mcg by mouth daily before breakfast.      . metFORMIN (GLUCOPHAGE) 500 MG tablet Take 500 mg by mouth 2 (two) times daily  with a meal.      . nitroGLYCERIN (NITROSTAT) 0.4 MG SL tablet Place 0.4 mg under the tongue every 5 (five) minutes as needed for chest pain.      . Olmesartan-Amlodipine-HCTZ (TRIBENZOR) 40-5-12.5 MG TABS Take 1 tablet by mouth daily.      . oxybutynin (DITROPAN-XL) 5 MG 24 hr tablet Take 5 mg by mouth daily.      . pantoprazole (PROTONIX) 40 MG tablet Take 40 mg by mouth daily.      . ranitidine (ZANTAC) 150 MG tablet Take 150 mg by mouth 2 (two) times daily.        Current medications: Current Facility-Administered Medications  Medication Dose Route Frequency Provider Last Rate Last Dose  . 0.9 %  sodium chloride infusion  250 mL Intravenous PRN James Hochrein, MD      . 0.9 %  sodium chloride infusion  1 mL/kg/hr Intravenous Continuous James Hochrein, MD 81.2 mL/hr at 04/29/13 0420 1 mL/kg/hr at 04/29/13 0420  . acetaminophen (TYLENOL) tablet 650 mg  650 mg Oral Q4H PRN   Karen-Akosua M Schooler, MD      . amLODipine (NORVASC) tablet 5 mg  5 mg Oral Daily James Ledford, RPH   5 mg at 04/28/13 1025  . aspirin EC tablet 325 mg  325 mg Oral Daily Karen-Akosua M Schooler, MD   325 mg at 04/28/13 1025  . atorvastatin (LIPITOR) tablet 80 mg  80 mg Oral Daily Karen-Akosua M Schooler, MD   80 mg at 04/28/13 1025  . enoxaparin (LOVENOX) injection 40 mg  40 mg Subcutaneous Q24H Karen-Akosua M Schooler, MD   40 mg at 04/28/13 1601  . hydrochlorothiazide (MICROZIDE) capsule 12.5 mg  12.5 mg Oral Daily James Ledford, RPH   12.5 mg at 04/28/13 1025  . ibuprofen (ADVIL,MOTRIN) tablet 400 mg  400 mg Oral Q6H PRN Sarah Cater, MD   400 mg at 04/28/13 1348  . irbesartan (AVAPRO) tablet 300 mg  300 mg Oral Daily James Ledford, RPH   300 mg at 04/28/13 1025  . levothyroxine (SYNTHROID, LEVOTHROID) tablet 75 mcg  75 mcg Oral QAC breakfast Karen-Akosua M Schooler, MD   75 mcg at 04/29/13 0641  . ondansetron (ZOFRAN) injection 4 mg  4 mg Intravenous Q6H PRN Karen-Akosua M Schooler, MD   4 mg at 04/27/13 1803  .  oxybutynin (DITROPAN-XL) 24 hr tablet 5 mg  5 mg Oral Daily Karen-Akosua M Schooler, MD   5 mg at 04/28/13 1025  . pantoprazole (PROTONIX) EC tablet 40 mg  40 mg Oral BID Jacquelyn Gill, MD   40 mg at 04/28/13 2132  . regadenoson (LEXISCAN) injection SOLN 0.4 mg  0.4 mg Intravenous Once Philip J [facility], MD      . sodium chloride 0.9 % injection 3 mL  3 mL Intravenous Q12H James Hochrein, MD   3 mL at 04/28/13 2138  . sodium chloride 0.9 % injection 3 mL  3 mL Intravenous PRN James Hochrein, MD      . sorbitol 70 % solution 50 mL  50 mL Oral BID Sarah Cater, MD   50 mL at 04/28/13 2139  . white petrolatum (VASELINE) gel   Topical PRN Eden W Jones, MD         No Known Allergies   Past Medical History  Diagnosis Date  . Stroke     2012  . Diabetes mellitus without complication   . Hypertension   . High cholesterol   . Thyroid disease     hypothyroid    Past Surgical History  Procedure Laterality Date  . Appendectomy    . Carpal tunnel release    . Cataracts      History reviewed. No pertinent family history.  Social History:  reports that she has never smoked. She does not have any smokeless tobacco history on file. She reports that she does not drink alcohol or use illicit drugs.    Physical Exam: BP 114/77  Pulse 76  Temp(Src) 98.1 F (36.7 C) (Oral)  Resp 16  Ht 5' 4" (1.626 m)  Wt 179 lb (81.194 kg)  BMI 30.71 kg/m2  SpO2 95%  General: Vital signs reviewed and noted. Well-developed, well-nourished, in no acute distress; alert, appropriate and cooperative throughout examination.  Head: Normocephalic, atraumatic, sclera anicteric, mucus membranes are moist  Neck: Supple. Negative for carotid bruits. JVD not elevated.  Lungs:  Few rales that cleared with deep breaths  Heart: RR with S1 S2. No murmurs, rubs, or gallops    Abdomen:  Soft, non-tender, non-distended with normoactive bowel sounds. No hepatomegaly.   No rebound/guarding. No obvious abdominal masses  MSK:  Strength and the appear normal for age.  Extremities: No clubbing or cyanosis. No edema.  Distal pedal pulses are 2+ and equal bilaterally.  Neurologic: Alert and oriented X 3. Moves all extremities spontaneously.  Psych: Responds to questions appropriately with a normal affect.    Lab results: Basic Metabolic Panel:  Recent Labs Lab 04/26/13 0630 04/26/13 1659 04/29/13 0400  NA 139  --  139  K 3.7  --  3.9  CL 100  --  102  CO2 29  --  28  GLUCOSE 101*  --  110*  BUN 10  --  15  CREATININE 0.81 0.88 1.07  CALCIUM 9.5  --  9.1    Liver Function Tests: No results found for this basename: AST, ALT, ALKPHOS, BILITOT, PROT, ALBUMIN,  in the last 168 hours No results found for this basename: LIPASE, AMYLASE,  in the last 168 hours No results found for this basename: AMMONIA,  in the last 168 hours  CBC:  Recent Labs Lab 04/26/13 0630 04/26/13 1659  WBC 8.0 8.4  NEUTROABS 4.8  --   HGB 14.2 13.2  HCT 41.3 40.1  MCV 88.6 88.9  PLT 271 282    Cardiac Enzymes:  Recent Labs Lab 04/26/13 0630 04/26/13 1720  TROPONINI <0.30 <0.30    BNP: No components found with this basename: POCBNP,   CBG:  Recent Labs Lab 04/28/13 0745 04/28/13 1107 04/28/13 1627 04/28/13 2055 04/29/13 0433  GLUCAP 107* 139* 172* 123* 105*    Coagulation Studies: No results found for this basename: LABPROT, INR,  in the last 72 hours   Other results:  EKG:  NSR at 64, T wave flattening laterally .   Imaging: Nm Myocar Multi W/spect W/wall Motion / Ef  04/27/2013   *RADIOLOGY REPORT*  Clinical Data:  Chest pain  MYOCARDIAL IMAGING WITH SPECT (REST AND PHARMACOLOGIC-STRESS) GATED LEFT VENTRICULAR WALL MOTION STUDY LEFT VENTRICULAR EJECTION FRACTION  Technique:  Standard myocardial SPECT imaging was performed after resting intravenous injection of 10 mCi Tc-99m sestamibi. Subsequently, intravenous infusion of regadenoson was performed under the supervision of the Cardiology staff.  At  peak effect of the drug, 30 mCi Tc-99m sestamibi was injected intravenously and standard myocardial SPECT  imaging was performed.  Quantitative gated imaging was also performed to evaluate left ventricular wall motion, and estimate left ventricular ejection fraction.  Comparison:  Chest x-ray 04/26/2013  Findings:  Evaluation of cardiac gated data demonstrates an end-diastolic volume of 58 ml and an end-systolic volume of 15 ml yielding a calculated ejection fraction of 74%.  No evidence of focal wall motion abnormality.  Adequate radiotracer uptake throughout the ventricular myocardium at rest.  However, on the following administration of Lexiscan there are two small foci of relatively decreased radiotracer uptake, one in the inferior wall of the mid ventricle and the seconds in the anteroseptal wall of the mid ventricle extending to the apex.  Both foci are concerning for areas of inducible ischemia.  IMPRESSION:  1.  Positive for two small areas of inducible ischemia, one in the inferior mid ventricle and the second in the anteroseptal mid ventricle extending to the apex. 2.  Normal cardiac wall motion. 3.  Calculated ejection fraction of 74%.   Original Report Authenticated By: Heath McCullough, M.D.       Assessment & Plan:  1. Chest tightness:   For cath today.  Discussed risks, options, benefits.  She understands and agrees   to proceed.      Philip J. [facility], Jr., MD, FACC 04/29/2013, 7:55 AM      

## 2013-04-29 NOTE — Progress Notes (Signed)
  Date: 04/29/2013  Patient name: Katrina Swanson  Medical record number: [id number]  Date of birth: 1945/08/19   This patient has been seen and the plan of care was discussed with the house staff. Please see their note for complete details. I concur with their findings with the following additions/corrections:  Plan for [facility] today by Cardiology given her findings on nuclear scan.  Will follow up results.  Patient will likely have to stay the night given timing of LHC.   Inez Catalina, MD 04/29/2013, 3:45 PM

## 2013-04-29 NOTE — Progress Notes (Signed)
Subjective:  Pt had LHC yesterday and tolerated procedure well. She has no further episodes of CP or dizziness. She is sitting on the side of her bed dressed and ready to go back to [place].   Objective: Vital signs in last 24 hours: Filed Vitals:   04/29/13 1815 04/29/13 1830 04/29/13 1900 04/29/13 1957  BP: 125/77 128/57 130/67 116/65  Pulse: 80 82  76  Temp:    97.8 F (36.6 C)  TempSrc:    Oral  Resp:    18  Height:      Weight:      SpO2: 98% 97% 98% 96%   Weight change:   Intake/Output Summary (Last 24 hours) at 04/29/13 2247 Last data filed at 04/29/13 2026  Gross per 24 hour  Intake 1540.15 ml  Output      1 ml  Net 1539.15 ml    Physical Exam  Constitutional: She is oriented to person, place, and time and well-developed, well-nourished, and in no distress.  HENT:  Head: Normocephalic and atraumatic.  Eyes: Conjunctivae are normal.  Cardiovascular: Normal rate, regular rhythm, normal heart sounds and intact distal pulses, no LE edema   Pulmonary/Chest: Effort normal and breath sounds normal. No respiratory distress.  Abdominal: Soft. Bowel sounds are normal. There is no tenderness.  Neurological: She is alert and oriented to person, place, and time.  Skin: Skin is warm and dry.    Lab Results: Basic Metabolic Panel:  Recent Labs Lab 04/26/13 0630 04/26/13 1659 04/29/13 0400  NA 139  --  139  K 3.7  --  3.9  CL 100  --  102  CO2 29  --  28  GLUCOSE 101*  --  110*  BUN 10  --  15  CREATININE 0.81 0.88 1.07  CALCIUM 9.5  --  9.1   CBC:  Recent Labs Lab 04/26/13 0630 04/26/13 1659  WBC 8.0 8.4  NEUTROABS 4.8  --   HGB 14.2 13.2  HCT 41.3 40.1  MCV 88.6 88.9  PLT 271 282   Cardiac Enzymes:  Recent Labs Lab 04/26/13 0630 04/26/13 1720  TROPONINI <0.30 <0.30   D-Dimer:  Recent Labs Lab 04/27/13 0930  DDIMER <0.27   CBG:  Recent Labs Lab 04/29/13 0433 04/29/13 0756 04/29/13 1127 04/29/13 1238 04/29/13 1540  04/29/13 2055  GLUCAP 105* 104* 78 142* 74 157*   Urine Drug Screen: Drugs of Abuse     Component Value Date/Time   LABOPIA NONE DETECTED 04/26/2013 1041   COCAINSCRNUR NONE DETECTED 04/26/2013 1041   LABBENZ NONE DETECTED 04/26/2013 1041   AMPHETMU NONE DETECTED 04/26/2013 1041   THCU NONE DETECTED 04/26/2013 1041   LABBARB NONE DETECTED 04/26/2013 1041    Alcohol Level:  Recent Labs Lab 04/26/13 1230  ETH <11    Studies/Results: No results found. Medications: I have reviewed the patient's current medications. Scheduled Meds: . amLODipine  5 mg Oral Daily  . aspirin EC  325 mg Oral Daily  . atorvastatin  80 mg Oral Daily  . hydrochlorothiazide  12.5 mg Oral Daily  . irbesartan  300 mg Oral Daily  . levothyroxine  75 mcg Oral QAC breakfast  . oxybutynin  5 mg Oral Daily  . pantoprazole  40 mg Oral BID  . sorbitol  50 mL Oral BID   Continuous Infusions: . sodium chloride 1 mL/kg/hr (04/29/13 1730)   PRN Meds:.acetaminophen, ibuprofen, ondansetron (ZOFRAN) IV, white petrolatum Assessment/Plan:  1. Atypical Chest Pain- Pt had been  experiencing 2-3 weeks of intermittent substernal chest pain radiating into the left shoulder and under the breast. She also has associated SOB, nausea, dizziness, and HA. CXR revealed no acute findings but thoracic spondylosis. CT of head revealed periventricular white matter and corona radiata hypodensities which favor chronic ischemic white matter disease with no acute intracranial findings. MRI of brain revealed no acute intracranial findings but mild atrophy with chronic microvascular ischemic change. She has risk factors for CAD and worked up as an ACS r/o. TIMI score: 3 which is a 13% risk at 14 days of all-cause mortality, new or recurrent MI, or severe recurrent ischemia requiring urgent revascularization. Wells Score: 1.5 which is low risk. Possible etiologies included: ACS, PE, Anxiety, GERD, Pneumothorax, Aortic Dissection. POCT was negative  and serial troponins negative. She was placed on telemetry and continued on ASA 325mg  daily and statin. D-dimer was also negative. TTE showed no abnormalities with a LVEF of 55-60%. Cardiology was consulted. Cardiology performed lexiscan which revealed 2 small areas of inducible ischemia. LHC yesterday revealed no significant CAD. No further recommendations by cardiology. Additionally, she received vesticular rehab for dizziness which has improved.  2. DM2- d/c metformin  -SSI  3. Dyslipidemia- continue home meds  4. Hypothyroidism- continue home meds  -TSH normal at 0.929 5. HTN- continue home meds  6. GERD-  -protonix bid   Dispo: Discharge today.   The patient does have a current PCP and does need an [facility] hospital follow-up appointment after discharge.  .Services Needed at time of discharge: Y = Yes, Blank = No PT:   OT:   RN:   Equipment:   Other:     LOS: 3 days   Boykin Peek, MD 04/29/2013, 10:47 PM

## 2013-04-29 NOTE — Progress Notes (Signed)
Subjective:  Pt reports no CP, SOB, or dizziness. She is getting cath today.  Objective: Vital signs in last 24 hours: Filed Vitals:   04/28/13 1432 04/28/13 1618 04/28/13 2100 04/29/13 0500  BP: 122/90 104/68 112/65 114/77  Pulse: 74 72 64 76  Temp: 98.7 F (37.1 C) 98.4 F (36.9 C) 97.6 F (36.4 C) 98.1 F (36.7 C)  TempSrc: Oral Oral    Resp: 19 18 18 16   Height:      Weight:      SpO2: 100% 97% 100% 95%   Weight change:   Intake/Output Summary (Last 24 hours) at 04/29/13 0835 Last data filed at 04/29/13 0500  Gross per 24 hour  Intake    560 ml  Output      0 ml  Net    560 ml    Physical Exam  Constitutional: She is oriented to person, place, and time and well-developed, well-nourished, and in no distress.  HENT:  Head: Normocephalic and atraumatic.  Eyes: Conjunctivae are normal.  Cardiovascular: Normal rate, regular rhythm, normal heart sounds and intact distal pulses, no LE edema   Pulmonary/Chest: Effort normal and breath sounds normal. No respiratory distress.  Abdominal: Soft. Bowel sounds are normal. There is no tenderness.  Neurological: She is alert and oriented to person, place, and time.  Skin: Skin is warm and dry.    Lab Results: Basic Metabolic Panel:  Recent Labs Lab 04/26/13 0630 04/26/13 1659 04/29/13 0400  NA 139  --  139  K 3.7  --  3.9  CL 100  --  102  CO2 29  --  28  GLUCOSE 101*  --  110*  BUN 10  --  15  CREATININE 0.81 0.88 1.07  CALCIUM 9.5  --  9.1   CBC:  Recent Labs Lab 04/26/13 0630 04/26/13 1659  WBC 8.0 8.4  NEUTROABS 4.8  --   HGB 14.2 13.2  HCT 41.3 40.1  MCV 88.6 88.9  PLT 271 282   Cardiac Enzymes:  Recent Labs Lab 04/26/13 0630 04/26/13 1720  TROPONINI <0.30 <0.30   D-Dimer:  Recent Labs Lab 04/27/13 0930  DDIMER <0.27   CBG:  Recent Labs Lab 04/28/13 0745 04/28/13 1107 04/28/13 1627 04/28/13 2055 04/29/13 0433 04/29/13 0756  GLUCAP 107* 139* 172* 123* 105* 104*   Urine  Drug Screen: Drugs of Abuse     Component Value Date/Time   LABOPIA NONE DETECTED 04/26/2013 1041   COCAINSCRNUR NONE DETECTED 04/26/2013 1041   LABBENZ NONE DETECTED 04/26/2013 1041   AMPHETMU NONE DETECTED 04/26/2013 1041   THCU NONE DETECTED 04/26/2013 1041   LABBARB NONE DETECTED 04/26/2013 1041    Alcohol Level:  Recent Labs Lab 04/26/13 1230  ETH <11    Studies/Results: Nm Myocar Multi W/spect W/wall Motion / Ef  04/27/2013   *RADIOLOGY REPORT*  Clinical Data:  Chest pain  MYOCARDIAL IMAGING WITH SPECT (REST AND PHARMACOLOGIC-STRESS) GATED LEFT VENTRICULAR WALL MOTION STUDY LEFT VENTRICULAR EJECTION FRACTION  Technique:  Standard myocardial SPECT imaging was performed after resting intravenous injection of 10 mCi Tc-57m sestamibi. Subsequently, intravenous infusion of regadenoson was performed under the supervision of the Cardiology staff.  At peak effect of the drug, 30 mCi Tc-17m sestamibi was injected intravenously and standard myocardial SPECT  imaging was performed.  Quantitative gated imaging was also performed to evaluate left ventricular wall motion, and estimate left ventricular ejection fraction.  Comparison:  Chest x-ray 04/26/2013  Findings:  Evaluation of cardiac gated data  demonstrates an end-diastolic volume of 58 ml and an end-systolic volume of 15 ml yielding a calculated ejection fraction of 74%.  No evidence of focal wall motion abnormality.  Adequate radiotracer uptake throughout the ventricular myocardium at rest.  However, on the following administration of Lexiscan there are two small foci of relatively decreased radiotracer uptake, one in the inferior wall of the mid ventricle and the seconds in the anteroseptal wall of the mid ventricle extending to the apex.  Both foci are concerning for areas of inducible ischemia.  IMPRESSION:  1.  Positive for two small areas of inducible ischemia, one in the inferior mid ventricle and the second in the anteroseptal mid ventricle  extending to the apex. 2.  Normal cardiac wall motion. 3.  Calculated ejection fraction of 74%.   Original Report Authenticated By: Malachy Moan, M.D.   Medications: I have reviewed the patient's current medications. Scheduled Meds: . amLODipine  5 mg Oral Daily  . aspirin EC  325 mg Oral Daily  . atorvastatin  80 mg Oral Daily  . enoxaparin (LOVENOX) injection  40 mg Subcutaneous Q24H  . hydrochlorothiazide  12.5 mg Oral Daily  . irbesartan  300 mg Oral Daily  . levothyroxine  75 mcg Oral QAC breakfast  . oxybutynin  5 mg Oral Daily  . pantoprazole  40 mg Oral BID  . regadenoson  0.4 mg Intravenous Once  . sodium chloride  3 mL Intravenous Q12H  . sorbitol  50 mL Oral BID   Continuous Infusions: . sodium chloride 1 mL/kg/hr (04/29/13 0420)   PRN Meds:.sodium chloride, acetaminophen, ibuprofen, ondansetron (ZOFRAN) IV, sodium chloride, white petrolatum Assessment/Plan:  1. Atypical Chest Pain- Pt had been experiencing 2-3 weeks of intermittent substernal chest pain radiating into the left shoulder and under the breast. She also has associated SOB, nausea, dizziness, and HA. CXR revealed no acute findings but thoracic spondylosis. CT of head reveals periventricular white matter and corona radiata hypodensities which favor chronic ischemic white matter disease with no acute intracranial findings. MRI of brain revealed no acute intracranial findings but mild atrophy with chronic microvascular ischemic change. She has risk factors for CAD and worked up as an ACS r/o. TIMI score: 3 which is a 13% risk at 14 days of all-cause mortality, new or recurrent MI, or severe recurrent ischemia requiring urgent revascularization. Wells Score: 1.5 which is low risk. Possible etiologies included: ACS, PE, Anxiety, GERD, Pneumothorax, Aortic Dissection. POCT was negative and serial troponins negative. She was placed on telemetry and continued on ASA 325mg  daily. D-dimer was negative. TTE showed no  abnormalities with a LVEF of 55-60%. Cardiology was consulted and lexiscan revealed 2 small areas of inducible ischemia with plans for a heart catherization today. She was NPO after midnight.  Additionally, she received vesticular rehab for dizziness which has improved.  -awaiting heart cath results today (9/29) -continue telemetry  -zofran PRN -continue ASA, statin  2. DM2- d/c metformin  -SSI  3. Dyslipidemia- continue home meds  4. Hypothyroidism- continue home meds  -TSH normal at 0.929 5. HTN- continue home meds  6. GERD-  -protonix bid   Dispo: Disposition is deferred at this time, awaiting improvement of current medical problems.  Anticipated discharge in approximately 1 day(s).   The patient does have a current PCP and does need an [facility] hospital follow-up appointment after discharge.  .Services Needed at time of discharge: Y = Yes, Blank = No PT:   OT:   RN:   Equipment:  Other:     LOS: 3 days   Boykin Peek, MD 04/29/2013, 8:35 AM

## 2013-04-29 NOTE — Progress Notes (Signed)
At 0203 pt having some bigemeny. Pt asymptomatic. Strips posted for MD. Will cont to monitor.

## 2013-04-29 NOTE — CV Procedure (Signed)
   Cardiac Catheterization Procedure Note  Name: Katrina Swanson MRN: [id number] DOB: 05/12/1946  Procedure: Left Heart Cath, Selective Coronary Angiography, LV angiography  Indication: 67 yo BF with multiple cardiac risk factors presents with atypical chest pain and dizzyness. Myoview study is abnormal suggesting mid inferior and mid to distal anterior ischemia.   Procedural Details: The right wrist was prepped, draped, and anesthetized with 1% lidocaine. Using the modified Seldinger technique, a 5 French sheath was introduced into the right radial artery. 3 mg of verapamil was administered through the sheath, weight-based unfractionated heparin was administered intravenously. Standard Judkins catheters were used for selective coronary angiography and left ventriculography. Catheter exchanges were performed over an exchange length guidewire. There were no immediate procedural complications. A TR band was used for radial hemostasis at the completion of the procedure.  The patient was transferred to the post catheterization recovery area for further monitoring.  Procedural Findings: Hemodynamics: AO 122/65 mean 89 mm Hg LV 131/8 mm Hg  Coronary angiography: Coronary dominance: right  Left mainstem: Normal   Left anterior descending (LAD): Normal  Left circumflex (LCx): Normal  Right coronary artery (RCA): Mild irregularities less than 10%.  Left ventriculography: Left ventricular systolic function is normal, LVEF is estimated at 55-65%, there is no significant mitral regurgitation   Final Conclusions:   1. No significant CAD 2. Normal LV function  Recommendations: Risk factor modification.  Theron Arista [facility] 04/29/2013, 5:15 PM

## 2013-04-29 NOTE — H&P (View-Only) (Signed)
CONSULT NOTE  Date: 04/29/2013               Patient Name:  Katrina Swanson MRN: [id number]  DOB: Oct 23, 1945 Age / Sex: 67 y.o., female        PCP: No PCP Per Patient Primary Cardiologist: [facility]            Referring Physician: Dr. Criselda Peaches              Reason for Consult:  CAD, Chest pain           History of Present Illness: Patient is a 68 y.o. female with a PMHx of cerebral vascular disease, who was admitted to [facility] on 04/26/2013 for evaluation of multiple symptoms of lightheadness, orthostatis, vertigo, stumbling, chest pain.   The CP has been present for several weeks.  She had 1 particularly bad night  ~ 2 week ago during which she took some NTG.   Since that time, she has had frequent episodes of CP.  Not associated with exercise ( does not exercise) .    The pain is a tight sensation - no dyspnea and is not associated with weakness or dizziness.  Working around the house does not cause the CP.   These last for several minutes, radiate to her left arm.  She has taked NTG on several occasions.   She was scheduled to see Dr. Eden Emms but could not wait until the apt because of worsening weakness and loss of balance.   Her main complaint is that of progressive weakness and dizziness.  This has also been going on for 2 weeks.  She had a hx of CVA 2 years and her medical doctor thought that she may have had another one.    She had a myoview which revealed possible ischemia of the mid / distal anterior wall and mid inferior wall.  She is scheduled for cath this am.    Medications: Outpatient medications: Prescriptions prior to admission  Medication Sig Dispense Refill  . aspirin 325 MG tablet Take 325 mg by mouth daily.      Marland Kitchen atorvastatin (LIPITOR) 80 MG tablet Take 80 mg by mouth daily.      Marland Kitchen levothyroxine (SYNTHROID, LEVOTHROID) 75 MCG tablet Take 75 mcg by mouth daily before breakfast.      . metFORMIN (GLUCOPHAGE) 500 MG tablet Take 500 mg by mouth 2 (two) times daily  with a meal.      . nitroGLYCERIN (NITROSTAT) 0.4 MG SL tablet Place 0.4 mg under the tongue every 5 (five) minutes as needed for chest pain.      . Olmesartan-Amlodipine-HCTZ (TRIBENZOR) 40-5-12.5 MG TABS Take 1 tablet by mouth daily.      Marland Kitchen oxybutynin (DITROPAN-XL) 5 MG 24 hr tablet Take 5 mg by mouth daily.      . pantoprazole (PROTONIX) 40 MG tablet Take 40 mg by mouth daily.      . ranitidine (ZANTAC) 150 MG tablet Take 150 mg by mouth 2 (two) times daily.        Current medications: Current Facility-Administered Medications  Medication Dose Route Frequency Provider Last Rate Last Dose  . 0.9 %  sodium chloride infusion  250 mL Intravenous PRN Rollene Rotunda, MD      . 0.9 %  sodium chloride infusion  1 mL/kg/hr Intravenous Continuous Rollene Rotunda, MD 81.2 mL/hr at 04/29/13 0420 1 mL/kg/hr at 04/29/13 0420  . acetaminophen (TYLENOL) tablet 650 mg  650 mg Oral Q4H PRN  Manuela Schwartz, MD      . amLODipine (NORVASC) tablet 5 mg  5 mg Oral Daily Abran Duke, RPH   5 mg at 04/28/13 1025  . aspirin EC tablet 325 mg  325 mg Oral Daily Manuela Schwartz, MD   325 mg at 04/28/13 1025  . atorvastatin (LIPITOR) tablet 80 mg  80 mg Oral Daily Manuela Schwartz, MD   80 mg at 04/28/13 1025  . enoxaparin (LOVENOX) injection 40 mg  40 mg Subcutaneous Q24H Manuela Schwartz, MD   40 mg at 04/28/13 1601  . hydrochlorothiazide (MICROZIDE) capsule 12.5 mg  12.5 mg Oral Daily Abran Duke, RPH   12.5 mg at 04/28/13 1025  . ibuprofen (ADVIL,MOTRIN) tablet 400 mg  400 mg Oral Q6H PRN Vivi Barrack, MD   400 mg at 04/28/13 1348  . irbesartan (AVAPRO) tablet 300 mg  300 mg Oral Daily Abran Duke, RPH   300 mg at 04/28/13 1025  . levothyroxine (SYNTHROID, LEVOTHROID) tablet 75 mcg  75 mcg Oral QAC breakfast Manuela Schwartz, MD   75 mcg at 04/29/13 [phone]  . ondansetron (ZOFRAN) injection 4 mg  4 mg Intravenous Q6H PRN Manuela Schwartz, MD   4 mg at 04/27/13 1803  .  oxybutynin (DITROPAN-XL) 24 hr tablet 5 mg  5 mg Oral Daily Manuela Schwartz, MD   5 mg at 04/28/13 1025  . pantoprazole (PROTONIX) EC tablet 40 mg  40 mg Oral BID Boykin Peek, MD   40 mg at 04/28/13 2132  . regadenoson (LEXISCAN) injection SOLN 0.4 mg  0.4 mg Intravenous Once Vesta Mixer, MD      . sodium chloride 0.9 % injection 3 mL  3 mL Intravenous Q12H Rollene Rotunda, MD   3 mL at 04/28/13 2138  . sodium chloride 0.9 % injection 3 mL  3 mL Intravenous PRN Rollene Rotunda, MD      . sorbitol 70 % solution 50 mL  50 mL Oral BID Vivi Barrack, MD   50 mL at 04/28/13 2139  . white petrolatum (VASELINE) gel   Topical PRN Courtney Paris, MD         No Known Allergies   Past Medical History  Diagnosis Date  . Stroke     2012  . Diabetes mellitus without complication   . Hypertension   . High cholesterol   . Thyroid disease     hypothyroid    Past Surgical History  Procedure Laterality Date  . Appendectomy    . Carpal tunnel release    . Cataracts      History reviewed. No pertinent family history.  Social History:  reports that she has never smoked. She does not have any smokeless tobacco history on file. She reports that she does not drink alcohol or use illicit drugs.    Physical Exam: BP 114/77  Pulse 76  Temp(Src) 98.1 F (36.7 C) (Oral)  Resp 16  Ht 5\' 4"  (1.626 m)  Wt 179 lb (81.194 kg)  BMI 30.71 kg/m2  SpO2 95%  General: Vital signs reviewed and noted. Well-developed, well-nourished, in no acute distress; alert, appropriate and cooperative throughout examination.  Head: Normocephalic, atraumatic, sclera anicteric, mucus membranes are moist  Neck: Supple. Negative for carotid bruits. JVD not elevated.  Lungs:  Few rales that cleared with deep breaths  Heart: RR with S1 S2. No murmurs, rubs, or gallops    Abdomen:  Soft, non-tender, non-distended with normoactive bowel sounds. No hepatomegaly.  No rebound/guarding. No obvious abdominal masses  MSK:  Strength and the appear normal for age.  Extremities: No clubbing or cyanosis. No edema.  Distal pedal pulses are 2+ and equal bilaterally.  Neurologic: Alert and oriented X 3. Moves all extremities spontaneously.  Psych: Responds to questions appropriately with a normal affect.    Lab results: Basic Metabolic Panel:  Recent Labs Lab 04/26/13 0630 04/26/13 1659 04/29/13 0400  NA 139  --  139  K 3.7  --  3.9  CL 100  --  102  CO2 29  --  28  GLUCOSE 101*  --  110*  BUN 10  --  15  CREATININE 0.81 0.88 1.07  CALCIUM 9.5  --  9.1    Liver Function Tests: No results found for this basename: AST, ALT, ALKPHOS, BILITOT, PROT, ALBUMIN,  in the last 168 hours No results found for this basename: LIPASE, AMYLASE,  in the last 168 hours No results found for this basename: AMMONIA,  in the last 168 hours  CBC:  Recent Labs Lab 04/26/13 0630 04/26/13 1659  WBC 8.0 8.4  NEUTROABS 4.8  --   HGB 14.2 13.2  HCT 41.3 40.1  MCV 88.6 88.9  PLT 271 282    Cardiac Enzymes:  Recent Labs Lab 04/26/13 0630 04/26/13 1720  TROPONINI <0.30 <0.30    BNP: No components found with this basename: POCBNP,   CBG:  Recent Labs Lab 04/28/13 0745 04/28/13 1107 04/28/13 1627 04/28/13 2055 04/29/13 0433  GLUCAP 107* 139* 172* 123* 105*    Coagulation Studies: No results found for this basename: LABPROT, INR,  in the last 72 hours   Other results:  EKG:  NSR at 64, T wave flattening laterally .   Imaging: Nm Myocar Multi W/spect W/wall Motion / Ef  04/27/2013   *RADIOLOGY REPORT*  Clinical Data:  Chest pain  MYOCARDIAL IMAGING WITH SPECT (REST AND PHARMACOLOGIC-STRESS) GATED LEFT VENTRICULAR WALL MOTION STUDY LEFT VENTRICULAR EJECTION FRACTION  Technique:  Standard myocardial SPECT imaging was performed after resting intravenous injection of 10 mCi Tc-23m sestamibi. Subsequently, intravenous infusion of regadenoson was performed under the supervision of the Cardiology staff.  At  peak effect of the drug, 30 mCi Tc-21m sestamibi was injected intravenously and standard myocardial SPECT  imaging was performed.  Quantitative gated imaging was also performed to evaluate left ventricular wall motion, and estimate left ventricular ejection fraction.  Comparison:  Chest x-ray 04/26/2013  Findings:  Evaluation of cardiac gated data demonstrates an end-diastolic volume of 58 ml and an end-systolic volume of 15 ml yielding a calculated ejection fraction of 74%.  No evidence of focal wall motion abnormality.  Adequate radiotracer uptake throughout the ventricular myocardium at rest.  However, on the following administration of Lexiscan there are two small foci of relatively decreased radiotracer uptake, one in the inferior wall of the mid ventricle and the seconds in the anteroseptal wall of the mid ventricle extending to the apex.  Both foci are concerning for areas of inducible ischemia.  IMPRESSION:  1.  Positive for two small areas of inducible ischemia, one in the inferior mid ventricle and the second in the anteroseptal mid ventricle extending to the apex. 2.  Normal cardiac wall motion. 3.  Calculated ejection fraction of 74%.   Original Report Authenticated By: Malachy Moan, M.D.       Assessment & Plan:  1. Chest tightness:   For cath today.  Discussed risks, options, benefits.  She understands and agrees  to proceed.      Vesta Mixer, Montez Hageman., MD, [facility] 04/29/2013, 7:55 AM

## 2013-04-29 NOTE — Progress Notes (Signed)
PT Cancellation Note  Patient Details Name: Katrina Swanson MRN: [id number] DOB: January 24, 1946   Cancelled Treatment:    Reason Eval/Treat Not Completed: Patient at procedure or test/unavailable.  Patient for cardiac cath today.  Will check on patient tomorrow if still in hospital. MD**Will need prescription for OP PT for Vestibular Rehab at discharge.   Vena Austria 04/29/2013, 1:27 PM Durenda Hurt Renaldo Fiddler, [facility] Acute Rehab Services Pager [phone]

## 2013-04-30 LAB — GLUCOSE, CAPILLARY: Glucose-Capillary: 98 mg/dL (ref 70–99)

## 2013-04-30 NOTE — Progress Notes (Signed)
CONSULT NOTE  Date: 04/30/2013               Patient Name:  Katrina Swanson MRN: [id number]  DOB: [birth year] Age / Sex: 67 y.o., female        PCP: No PCP Per Patient Primary Cardiologist: New [facility]            Referring Physician: Dr. Criselda Peaches              Reason for Consult:  CAD, Chest pain           History of Present Illness: Patient is a 67 y.o. female with a PMHx of cerebral vascular disease, who was admitted to [facility] on 04/26/2013 for evaluation of multiple symptoms of lightheadness, orthostatis, vertigo, stumbling, chest pain.   The CP has been present for several weeks.  She had 1 particularly bad night  ~ 2 week ago during which she took some NTG.   Since that time, she has had frequent episodes of CP.  Not associated with exercise ( does not exercise) .    The pain is a tight sensation - no dyspnea and is not associated with weakness or dizziness.  Working around the house does not cause the CP.   These last for several minutes, radiate to her left arm.  She has taked NTG on several occasions.   She was scheduled to see Dr. Eden Emms but could not wait until the apt because of worsening weakness and loss of balance.   Her main complaint is that of progressive weakness and dizziness.  This has also been going on for 2 weeks.  She had a hx of CVA 2 years and her medical doctor thought that she may have had another one.    She had a myoview which revealed possible ischemia of the mid / distal anterior wall and mid inferior wall.  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  She is feeling better.   Medications: Outpatient medications: Prescriptions prior to admission  Medication Sig Dispense Refill  . aspirin 325 MG tablet Take 325 mg by mouth daily.      Marland Kitchen atorvastatin (LIPITOR) 80 MG tablet Take 80 mg by mouth daily.      Marland Kitchen levothyroxine (SYNTHROID, LEVOTHROID) 75 MCG tablet Take 75 mcg by mouth daily before breakfast.      . metFORMIN (GLUCOPHAGE) 500 MG tablet Take 500 mg by  mouth 2 (two) times daily with a meal.      . nitroGLYCERIN (NITROSTAT) 0.4 MG SL tablet Place 0.4 mg under the tongue every 5 (five) minutes as needed for chest pain.      . Olmesartan-Amlodipine-HCTZ (TRIBENZOR) 40-5-12.5 MG TABS Take 1 tablet by mouth daily.      Marland Kitchen oxybutynin (DITROPAN-XL) 5 MG 24 hr tablet Take 5 mg by mouth daily.      . pantoprazole (PROTONIX) 40 MG tablet Take 40 mg by mouth daily.      . ranitidine (ZANTAC) 150 MG tablet Take 150 mg by mouth 2 (two) times daily.        Current medications: Current Facility-Administered Medications  Medication Dose Route Frequency Provider Last Rate Last Dose  . acetaminophen (TYLENOL) tablet 650 mg  650 mg Oral Q4H PRN Manuela Schwartz, MD      . amLODipine (NORVASC) tablet 5 mg  5 mg Oral Daily Abran Duke, RPH   5 mg at 04/29/13 0942  . aspirin EC tablet 325 mg  325 mg Oral Daily Eston Esters  Bosie Clos, MD   325 mg at 04/29/13 0941  . atorvastatin (LIPITOR) tablet 80 mg  80 mg Oral Daily Manuela Schwartz, MD   80 mg at 04/29/13 0944  . hydrochlorothiazide (MICROZIDE) capsule 12.5 mg  12.5 mg Oral Daily Abran Duke, RPH   12.5 mg at 04/28/13 1025  . ibuprofen (ADVIL,MOTRIN) tablet 400 mg  400 mg Oral Q6H PRN Vivi Barrack, MD   400 mg at 04/28/13 1348  . irbesartan (AVAPRO) tablet 300 mg  300 mg Oral Daily Abran Duke, RPH   300 mg at 04/29/13 0947  . levothyroxine (SYNTHROID, LEVOTHROID) tablet 75 mcg  75 mcg Oral QAC breakfast Manuela Schwartz, MD   75 mcg at 04/29/13 [phone]  . ondansetron (ZOFRAN) injection 4 mg  4 mg Intravenous Q6H PRN Manuela Schwartz, MD   4 mg at 04/27/13 1803  . oxybutynin (DITROPAN-XL) 24 hr tablet 5 mg  5 mg Oral Daily Manuela Schwartz, MD   5 mg at 04/29/13 0947  . pantoprazole (PROTONIX) EC tablet 40 mg  40 mg Oral BID Boykin Peek, MD   40 mg at 04/29/13 2238  . sorbitol 70 % solution 50 mL  50 mL Oral BID Vivi Barrack, MD   50 mL at 04/28/13 2139  . white  petrolatum (VASELINE) gel   Topical PRN Courtney Paris, MD         No Known Allergies   Past Medical History  Diagnosis Date  . Stroke     2012  . Diabetes mellitus without complication   . Hypertension   . High cholesterol   . Thyroid disease     hypothyroid    Past Surgical History  Procedure Laterality Date  . Appendectomy    . Carpal tunnel release    . Cataracts      History reviewed. No pertinent family history.  Social History:  reports that she has never smoked. She does not have any smokeless tobacco history on file. She reports that she does not drink alcohol or use illicit drugs.    Physical Exam: BP 122/56  Pulse 67  Temp(Src) 97.9 F (36.6 C) (Oral)  Resp 18  Ht 5\' 4"  (1.626 m)  Wt 179 lb (81.194 kg)  BMI 30.71 kg/m2  SpO2 98%  General: Vital signs reviewed and noted. Well-developed, well-nourished, in no acute distress; alert, appropriate and cooperative throughout examination.  Head: Normocephalic, atraumatic, sclera anicteric, mucus membranes are moist  Neck: Supple. Negative for carotid bruits. JVD not elevated.  Lungs:  Few rales that cleared with deep breaths  Heart: RR with S1 S2. No murmurs, rubs, or gallops    Abdomen:  Soft, non-tender, non-distended with normoactive bowel sounds. No hepatomegaly. No rebound/guarding. No obvious abdominal masses  MSK: Strength and the appear normal for age.  Extremities: No clubbing or cyanosis. No edema.  Distal pedal pulses are 2+ and equal bilaterally.  Neurologic: Alert and oriented X 3. Moves all extremities spontaneously.  Psych: Responds to questions appropriately with a normal affect.    Lab results: Basic Metabolic Panel:  Recent Labs Lab 04/26/13 0630 04/26/13 1659 04/29/13 0400  NA 139  --  139  K 3.7  --  3.9  CL 100  --  102  CO2 29  --  28  GLUCOSE 101*  --  110*  BUN 10  --  15  CREATININE 0.81 0.88 1.07  CALCIUM 9.5  --  9.1    Liver Function Tests: No results  found for this  basename: AST, ALT, ALKPHOS, BILITOT, PROT, ALBUMIN,  in the last 168 hours No results found for this basename: LIPASE, AMYLASE,  in the last 168 hours No results found for this basename: AMMONIA,  in the last 168 hours  CBC:  Recent Labs Lab 04/26/13 0630 04/26/13 1659  WBC 8.0 8.4  NEUTROABS 4.8  --   HGB 14.2 13.2  HCT 41.3 40.1  MCV 88.6 88.9  PLT 271 282    Cardiac Enzymes:  Recent Labs Lab 04/26/13 0630 04/26/13 1720  TROPONINI <0.30 <0.30    BNP: No components found with this basename: POCBNP,   CBG:  Recent Labs Lab 04/29/13 1127 04/29/13 1238 04/29/13 1540 04/29/13 2055 04/30/13 0728  GLUCAP 78 142* 74 157* 98    Coagulation Studies:  Recent Labs  04/29/13 1125  LABPROT 12.4  INR 0.94     Other results:  EKG:  NSR at 64, T wave flattening laterally .   Imaging: No results found.     Assessment & Plan:  1. Chest tightness:   Noncardiac CP.  Cath showed minimal CAD.  Will sign off.  She can go home from my standpoint.  She does not need to  follow up with cardiology.     Vesta Mixer, Montez Hageman., MD, [facility] 04/30/2013, 8:35 AM

## 2013-05-03 LAB — GLUCOSE, CAPILLARY: Glucose-Capillary: 87 mg/dL (ref 70–99)

## 2013-05-11 ENCOUNTER — Encounter: Payer: Self-pay | Admitting: Cardiovascular Disease

## 2013-05-13 ENCOUNTER — Institutional Professional Consult (permissible substitution): Payer: Self-pay | Admitting: Cardiovascular Disease

## 2013-06-04 ENCOUNTER — Encounter: Payer: Self-pay | Admitting: Cardiovascular Disease

## 2014-01-12 ENCOUNTER — Inpatient Hospital Stay (HOSPITAL_COMMUNITY)
Admission: EM | Admit: 2014-01-12 | Discharge: 2014-01-13 | DRG: 069 | Disposition: A | Discharge status: Home / Self Care | Payer: Medicare Other | Attending: Internal Medicine | Admitting: Internal Medicine

## 2014-01-12 ENCOUNTER — Encounter (HOSPITAL_COMMUNITY): Payer: Self-pay | Admitting: Emergency Medicine

## 2014-01-12 DIAGNOSIS — R9439 Abnormal result of other cardiovascular function study: Secondary | ICD-10-CM

## 2014-01-12 DIAGNOSIS — E039 Hypothyroidism, unspecified: Secondary | ICD-10-CM | POA: Diagnosis present

## 2014-01-12 DIAGNOSIS — Z8673 Personal history of transient ischemic attack (TIA), and cerebral infarction without residual deficits: Secondary | ICD-10-CM

## 2014-01-12 DIAGNOSIS — G459 Transient cerebral ischemic attack, unspecified: Principal | ICD-10-CM | POA: Diagnosis present

## 2014-01-12 DIAGNOSIS — E119 Type 2 diabetes mellitus without complications: Secondary | ICD-10-CM | POA: Diagnosis present

## 2014-01-12 DIAGNOSIS — G453 Amaurosis fugax: Secondary | ICD-10-CM

## 2014-01-12 DIAGNOSIS — E785 Hyperlipidemia, unspecified: Secondary | ICD-10-CM | POA: Diagnosis present

## 2014-01-12 DIAGNOSIS — Z7982 Long term (current) use of aspirin: Secondary | ICD-10-CM

## 2014-01-12 DIAGNOSIS — Z79899 Other long term (current) drug therapy: Secondary | ICD-10-CM

## 2014-01-12 DIAGNOSIS — I10 Essential (primary) hypertension: Secondary | ICD-10-CM | POA: Diagnosis present

## 2014-01-12 DIAGNOSIS — I639 Cerebral infarction, unspecified: Secondary | ICD-10-CM

## 2014-01-12 DIAGNOSIS — H8110 Benign paroxysmal vertigo, unspecified ear: Secondary | ICD-10-CM

## 2014-01-12 DIAGNOSIS — G43109 Migraine with aura, not intractable, without status migrainosus: Secondary | ICD-10-CM | POA: Diagnosis present

## 2014-01-12 LAB — COMPREHENSIVE METABOLIC PANEL
ALT: 14 U/L (ref 0–35)
AST: 23 U/L (ref 0–37)
Albumin: 3.7 g/dL (ref 3.5–5.2)
Alkaline Phosphatase: 85 U/L (ref 39–117)
BUN: 11 mg/dL (ref 6–23)
CO2: 25 mEq/L (ref 19–32)
Calcium: 9.7 mg/dL (ref 8.4–10.5)
Chloride: 100 mEq/L (ref 96–112)
Creatinine, Ser: 0.94 mg/dL (ref 0.50–1.10)
GFR calc Af Amer: 70 mL/min — ABNORMAL LOW (ref >90)
GFR calc non Af Amer: 60 mL/min — ABNORMAL LOW (ref >90)
Glucose, Bld: 119 mg/dL — ABNORMAL HIGH (ref 70–99)
Potassium: 4 mEq/L (ref 3.7–5.3)
Sodium: 141 mEq/L (ref 137–147)
Total Bilirubin: <0.2 mg/dL — ABNORMAL LOW (ref 0.3–1.2)
Total Protein: 7.3 g/dL (ref 6.0–8.3)

## 2014-01-12 LAB — CBC
HCT: 39.9 % (ref 36.0–46.0)
Hemoglobin: 12.9 g/dL (ref 12.0–15.0)
MCH: 29.3 pg (ref 26.0–34.0)
MCHC: 32.3 g/dL (ref 30.0–36.0)
MCV: 90.7 fL (ref 78.0–100.0)
Platelets: 256 10*3/uL (ref 150–400)
RBC: 4.4 MIL/uL (ref 3.87–5.11)
RDW: 13.1 % (ref 11.5–15.5)
WBC: 7.7 10*3/uL (ref 4.0–10.5)

## 2014-01-12 LAB — URINALYSIS, ROUTINE W REFLEX MICROSCOPIC
Bilirubin Urine: NEGATIVE
Glucose, UA: NEGATIVE mg/dL
Hgb urine dipstick: NEGATIVE
Ketones, ur: NEGATIVE mg/dL
NITRITE: NEGATIVE
PH: 5 (ref 5.0–8.0)
Protein, ur: NEGATIVE mg/dL
SPECIFIC GRAVITY, URINE: 1.015 (ref 1.005–1.030)
Urobilinogen, UA: 0.2 mg/dL (ref 0.0–1.0)

## 2014-01-12 LAB — RAPID URINE DRUG SCREEN, HOSP PERFORMED
Amphetamines: NOT DETECTED
BARBITURATES: NOT DETECTED
Benzodiazepines: NOT DETECTED
Cocaine: NOT DETECTED
Opiates: NOT DETECTED
TETRAHYDROCANNABINOL: NOT DETECTED

## 2014-01-12 LAB — DIFFERENTIAL
Basophils Absolute: 0.1 10*3/uL (ref 0.0–0.1)
Basophils Relative: 1 % (ref 0–1)
Eosinophils Absolute: 0.3 10*3/uL (ref 0.0–0.7)
Eosinophils Relative: 4 % (ref 0–5)
Lymphocytes Relative: 32 % (ref 12–46)
Lymphs Abs: 2.5 10*3/uL (ref 0.7–4.0)
Monocytes Absolute: 0.6 10*3/uL (ref 0.1–1.0)
Monocytes Relative: 8 % (ref 3–12)
Neutro Abs: 4.3 10*3/uL (ref 1.7–7.7)
Neutrophils Relative %: 55 % (ref 43–77)

## 2014-01-12 LAB — URINE MICROSCOPIC-ADD ON

## 2014-01-12 LAB — PROTIME-INR
INR: 0.92 (ref 0.00–1.49)
Prothrombin Time: 12.2 seconds (ref 11.6–15.2)

## 2014-01-12 LAB — ETHANOL: Alcohol, Ethyl (B): <11 mg/dL (ref 0–11)

## 2014-01-12 LAB — I-STAT TROPONIN, ED: TROPONIN I, POC: 0 ng/mL (ref 0.00–0.08)

## 2014-01-12 LAB — APTT: aPTT: 33 seconds (ref 24–37)

## 2014-01-12 NOTE — ED Notes (Signed)
Pt automatically failed on stroke swallow screen due to hx of dysphagia after throat surgery. Pt states that she has eaten and drank fluids today with no problem. Explained reasoning for failing swallow study. Pt states understanding.

## 2014-01-12 NOTE — ED Notes (Signed)
Dr. Docherty at bedside.

## 2014-01-12 NOTE — ED Provider Notes (Signed)
CSN: [id number]     Arrival date & time 01/12/14  2024 History   First MD Initiated Contact with Patient 01/12/14 2142     Chief Complaint  Patient presents with  . Weakness  . Fatigue     (Consider location/radiation/quality/duration/timing/severity/associated sxs/prior Treatment) HPI Comments: Pt reports several hours of dizziness beginning around 6pm last night, then developing a partial L eye visual defect described as lower half of vision missing for 5-6 mins. She went to bed, this morning these symptoms have resolved, though she reports "pressure" in the back of her head & L side of face.   Patient is a 68 y.o. female presenting with dizziness. The history is provided by the patient and a relative. No language interpreter was used.  Dizziness Quality:  Vertigo Severity:  Moderate Onset quality:  Sudden Duration:  3 hours Timing:  Constant Progression:  Resolved Chronicity:  Recurrent Context: inactivity   Relieved by:  Nothing Worsened by:  Nothing tried Ineffective treatments:  None tried Associated symptoms: vision changes   Associated symptoms: no chest pain, no diarrhea, no headaches, no nausea, no palpitations, no shortness of breath, no syncope, no vomiting and no weakness   Risk factors: hx of stroke and hx of vertigo     Past Medical History  Diagnosis Date  . Stroke     2012  . Diabetes mellitus without complication   . Hypertension   . High cholesterol   . Thyroid disease     hypothyroid   Past Surgical History  Procedure Laterality Date  . Appendectomy    . Carpal tunnel release    . Cataracts    . Cardiac catheterization     Family History  Problem Relation Age of Onset  . Diabetes Mellitus II Mother   . CAD Father   . Cancer Maternal Aunt    History  Substance Use Topics  . Smoking status: Never Smoker   . Smokeless tobacco: Not on file  . Alcohol Use: No   OB History   Grav Para Term Preterm Abortions TAB SAB Ect Mult Living                  Review of Systems  Constitutional: Negative for fever, chills, diaphoresis, activity change, appetite change and fatigue.  HENT: Negative for congestion, facial swelling, rhinorrhea and sore throat.   Eyes: Positive for visual disturbance. Negative for photophobia and discharge.  Respiratory: Negative for cough, chest tightness and shortness of breath.   Cardiovascular: Negative for chest pain, palpitations, leg swelling and syncope.  Gastrointestinal: Negative for nausea, vomiting, abdominal pain and diarrhea.  Endocrine: Negative for polydipsia and polyuria.  Genitourinary: Negative for dysuria, frequency, difficulty urinating and pelvic pain.  Musculoskeletal: Positive for extremity weakness. Negative for arthralgias, back pain, neck pain and neck stiffness.  Skin: Negative for color change and wound.  Allergic/Immunologic: Negative for immunocompromised state.  Neurological: Positive for dizziness. Negative for facial asymmetry, weakness, numbness and headaches.  Hematological: Does not bruise/bleed easily.  Psychiatric/Behavioral: Negative for confusion and agitation.      Allergies  Review of patient's allergies indicates no known allergies.  Home Medications   Prior to Admission medications   Medication Sig Start Date End Date Taking? Authorizing Provider  atorvastatin (LIPITOR) 80 MG tablet Take 80 mg by mouth at bedtime.    Yes Historical Provider, MD  Hypromellose (ARTIFICIAL TEARS OP) Place 1 drop into both eyes 4 (four) times daily as needed (dry eyes).   Yes Historical  Provider, MD  levothyroxine (SYNTHROID, LEVOTHROID) 75 MCG tablet Take 75 mcg by mouth daily before breakfast.   Yes Historical Provider, MD  metFORMIN (GLUCOPHAGE) 500 MG tablet Take 1,000 mg by mouth 2 (two) times daily with a meal.    Yes Historical Provider, MD  nitroGLYCERIN (NITROSTAT) 0.4 MG SL tablet Place 0.4 mg under the tongue every 5 (five) minutes as needed for chest pain.   Yes  Historical Provider, MD  Olmesartan-Amlodipine-HCTZ (TRIBENZOR) 20-5-12.5 MG TABS Take 1 tablet by mouth daily.   Yes Historical Provider, MD  oxybutynin (DITROPAN-XL) 5 MG 24 hr tablet Take 5 mg by mouth daily.   Yes Historical Provider, MD  pantoprazole (PROTONIX) 40 MG tablet Take 40 mg by mouth 2 (two) times daily before a meal.    Yes Historical Provider, MD  tetrahydrozoline-zinc (VISINE-AC) 0.05-0.25 % ophthalmic solution Place 1 drop into both eyes 3 (three) times daily as needed (itching, redness).   Yes Historical Provider, MD  aspirin 325 MG tablet Take 325 mg by mouth 3 (three) times a week.     Historical Provider, MD   BP 130/75  Pulse 69  Temp(Src) 98 F (36.7 C) (Oral)  Resp 18  Ht 5\' 4"  (1.626 m)  Wt 186 lb 4.6 oz (84.5 kg)  BMI 31.96 kg/m2  SpO2 100% Physical Exam  Constitutional: She is oriented to person, place, and time. She appears well-developed and well-nourished. No distress.  HENT:  Head: Normocephalic and atraumatic.  Mouth/Throat: No oropharyngeal exudate.  Eyes: Pupils are equal, round, and reactive to light.  Neck: Normal range of motion. Neck supple.  Cardiovascular: Normal rate, regular rhythm and normal heart sounds.  Exam reveals no gallop and no friction rub.   No murmur heard. Pulmonary/Chest: Effort normal and breath sounds normal. No respiratory distress. She has no wheezes. She has no rales.  Abdominal: Soft. Bowel sounds are normal. She exhibits no distension and no mass. There is no tenderness. There is no rebound and no guarding.  Musculoskeletal: Normal range of motion. She exhibits no edema and no tenderness.  Neurological: She is alert and oriented to person, place, and time. She has normal strength. She displays no tremor. No cranial nerve deficit or sensory deficit. She exhibits normal muscle tone. She displays a negative Romberg sign. Coordination and gait normal. GCS eye subscore is 4. GCS verbal subscore is 5. GCS motor subscore is 6.   Skin: Skin is warm and dry.  Psychiatric: She has a normal mood and affect.    ED Course  Procedures (including critical care time) Labs Review Labs Reviewed  COMPREHENSIVE METABOLIC PANEL - Abnormal; Notable for the following:    Glucose, Bld 119 (*)    Total Bilirubin <0.2 (*)    GFR calc non Af Amer 60 (*)    GFR calc Af Amer 70 (*)    All other components within normal limits  URINALYSIS, ROUTINE W REFLEX MICROSCOPIC - Abnormal; Notable for the following:    Leukocytes, UA TRACE (*)    All other components within normal limits  COMPREHENSIVE METABOLIC PANEL - Abnormal; Notable for the following:    Glucose, Bld 113 (*)    Albumin 3.4 (*)    GFR calc non Af Amer 65 (*)    GFR calc Af Amer 75 (*)    All other components within normal limits  GLUCOSE, CAPILLARY - Abnormal; Notable for the following:    Glucose-Capillary 113 (*)    All other components within normal limits  GLUCOSE, CAPILLARY - Abnormal; Notable for the following:    Glucose-Capillary 136 (*)    All other components within normal limits  HEMOGLOBIN A1C - Abnormal; Notable for the following:    Hemoglobin A1C 6.4 (*)    Mean Plasma Glucose 137 (*)    All other components within normal limits  LIPID PANEL - Abnormal; Notable for the following:    Triglycerides 281 (*)    HDL 38 (*)    VLDL 56 (*)    All other components within normal limits  GLUCOSE, CAPILLARY - Abnormal; Notable for the following:    Glucose-Capillary 131 (*)    All other components within normal limits  ETHANOL  PROTIME-INR  APTT  CBC  DIFFERENTIAL  URINE RAPID DRUG SCREEN (HOSP PERFORMED)  URINE MICROSCOPIC-ADD ON  CBC WITH DIFFERENTIAL  SEDIMENTATION RATE  I-STAT TROPOININ, ED    Imaging Review Ct Head Wo Contrast  01/13/2014   CLINICAL DATA:  Visual changes. Dizziness. Transient vision loss in the left thigh.  EXAM: CT HEAD WITHOUT CONTRAST  TECHNIQUE: Contiguous axial images were obtained from the base of the skull through  the vertex without intravenous contrast.  COMPARISON:  04/26/2013 MRI.  FINDINGS: No mass lesion, mass effect, midline shift, hydrocephalus, hemorrhage. No acute territorial cortical ischemia/infarct. Atrophy and chronic ischemic white matter disease is present. Calvarium intact. Paranasal sinuses appear within normal limits. Intracranial atherosclerosis. Scout images appear within normal limits.  IMPRESSION: Atrophy and chronic ischemic white matter disease without acute intracranial abnormality.   Electronically Signed   By: Dereck Ligas M.D.   On: 01/13/2014 00:42   Mr Brain Wo Contrast  01/13/2014   CLINICAL DATA:  68 year old female with history of diabetes, high blood pressure and high cholesterol presenting with decreased vision left lower visual field 01/11/2014. Initial dizziness and difficulty walking. Symptoms have improved with intermittent headache.  EXAM: MRI HEAD WITHOUT CONTRAST  MRA HEAD WITHOUT CONTRAST  TECHNIQUE: Multiplanar, multiecho pulse sequences of the brain and surrounding structures were obtained without intravenous contrast. Angiographic images of the head were obtained using MRA technique without contrast.  COMPARISON:  01/13/2014 CT.  04/26/2013 MR.  FINDINGS: MRI HEAD FINDINGS  No acute infarct.  No intracranial hemorrhage.  Mild to moderate small vessel disease type changes.  No hydrocephalus.  No intracranial mass lesion noted on this unenhanced exam.  Mild transverse ligament hypertrophy. Mild cervical spondylotic changes upper cervical spine. Cervical canal appears congenitally narrowed.  No focal orbital abnormality noted.  MRA HEAD FINDINGS  Anterior circulation without medium or large size vessel significant stenosis or occlusion.  Mild ectasia left internal carotid artery cavernous segment.  Mild irregularity middle cerebral artery branch vessels.  Left vertebral artery is dominant. No significant stenosis of the distal vertebral arteries or basilar artery.  Left  posterior inferior cerebellar artery is not visualized.  Tiny bulge basilar tip may represent a tiny aneurysm (1-2 mm).  Left posterior cerebral artery distal branch vessel mild irregularity.  IMPRESSION: MRI HEAD:  No acute infarct.  Mild to moderate small vessel disease type changes.  MRA HEAD:  No medium or large size vessel significant stenosis or occlusion.  Tiny bulge basilar tip may represent a tiny aneurysm (1-2 mm).  Please see above.   Electronically Signed   By: Chauncey Cruel M.D.   On: 01/13/2014 11:47   Mr Jodene Nam Head/brain Wo Cm  01/13/2014   CLINICAL DATA:  68 year old female with history of diabetes, high blood pressure and high cholesterol presenting with  decreased vision left lower visual field 01/11/2014. Initial dizziness and difficulty walking. Symptoms have improved with intermittent headache.  EXAM: MRI HEAD WITHOUT CONTRAST  MRA HEAD WITHOUT CONTRAST  TECHNIQUE: Multiplanar, multiecho pulse sequences of the brain and surrounding structures were obtained without intravenous contrast. Angiographic images of the head were obtained using MRA technique without contrast.  COMPARISON:  01/13/2014 CT.  04/26/2013 MR.  FINDINGS: MRI HEAD FINDINGS  No acute infarct.  No intracranial hemorrhage.  Mild to moderate small vessel disease type changes.  No hydrocephalus.  No intracranial mass lesion noted on this unenhanced exam.  Mild transverse ligament hypertrophy. Mild cervical spondylotic changes upper cervical spine. Cervical canal appears congenitally narrowed.  No focal orbital abnormality noted.  MRA HEAD FINDINGS  Anterior circulation without medium or large size vessel significant stenosis or occlusion.  Mild ectasia left internal carotid artery cavernous segment.  Mild irregularity middle cerebral artery branch vessels.  Left vertebral artery is dominant. No significant stenosis of the distal vertebral arteries or basilar artery.  Left posterior inferior cerebellar artery is not visualized.  Tiny  bulge basilar tip may represent a tiny aneurysm (1-2 mm).  Left posterior cerebral artery distal branch vessel mild irregularity.  IMPRESSION: MRI HEAD:  No acute infarct.  Mild to moderate small vessel disease type changes.  MRA HEAD:  No medium or large size vessel significant stenosis or occlusion.  Tiny bulge basilar tip may represent a tiny aneurysm (1-2 mm).  Please see above.   Electronically Signed   By: Chauncey Cruel M.D.   On: 01/13/2014 11:47     EKG Interpretation   Date/Time:  Sunday January 12 2014 22:18:25 EDT Ventricular Rate:  75 PR Interval:  163 QRS Duration: 84 QT Interval:  394 QTC Calculation: 440 R Axis:   36 Text Interpretation:  Sinus rhythm Borderline T wave abnormalities No  significant change was found Confirmed by DOCHERTY  MD, MEGAN ([id number]) on  01/12/2014 10:22:10 PM      MDM   Final diagnoses:  TIA (transient ischemic attack)    Pt is a 68 y.o. female with Pmhx as above who presents with several hours of dizziness beginning around 6pm last night, then developing a partial L eye visual defect described as lower half of vision missing for 5-6 mins. Today she had malaise and mild posterior and L sided h/a. On PE, VSS, pt in NAD. No focal neuro findings. Given concern for TIA/CVA, CT head ordered and was negative for acute findings. Neurology consulted, will see pt, rec [facility] to admit for further CVA/TIA w/u. Neta Ehlers, MD 01/14/14 1324

## 2014-01-12 NOTE — ED Notes (Addendum)
Pt states that she had a stroke 3 years ago, takes 325 mg ASA three times a week. States that she thinks 325 mg ASA every day is too much.

## 2014-01-12 NOTE — ED Notes (Signed)
Patient presents stating that last night she started feeling funny.  She stated that her head started hurting and feeling "funny", her left eye isn't seeing clearly.  Equal hand grips, follow commands.

## 2014-01-13 ENCOUNTER — Emergency Department (HOSPITAL_COMMUNITY): Payer: Medicare Other

## 2014-01-13 ENCOUNTER — Encounter (HOSPITAL_COMMUNITY): Payer: Self-pay | Admitting: Internal Medicine

## 2014-01-13 ENCOUNTER — Inpatient Hospital Stay (HOSPITAL_COMMUNITY): Payer: Medicare Other

## 2014-01-13 DIAGNOSIS — H3400 Transient retinal artery occlusion, unspecified eye: Secondary | ICD-10-CM

## 2014-01-13 DIAGNOSIS — G459 Transient cerebral ischemic attack, unspecified: Principal | ICD-10-CM

## 2014-01-13 DIAGNOSIS — E119 Type 2 diabetes mellitus without complications: Secondary | ICD-10-CM | POA: Diagnosis present

## 2014-01-13 DIAGNOSIS — R42 Dizziness and giddiness: Secondary | ICD-10-CM

## 2014-01-13 DIAGNOSIS — E039 Hypothyroidism, unspecified: Secondary | ICD-10-CM

## 2014-01-13 DIAGNOSIS — G453 Amaurosis fugax: Secondary | ICD-10-CM | POA: Diagnosis present

## 2014-01-13 DIAGNOSIS — I519 Heart disease, unspecified: Secondary | ICD-10-CM

## 2014-01-13 DIAGNOSIS — I10 Essential (primary) hypertension: Secondary | ICD-10-CM

## 2014-01-13 DIAGNOSIS — I639 Cerebral infarction, unspecified: Secondary | ICD-10-CM | POA: Diagnosis present

## 2014-01-13 LAB — CBC WITH DIFFERENTIAL/PLATELET
Basophils Absolute: 0 10*3/uL (ref 0.0–0.1)
Basophils Relative: 1 % (ref 0–1)
EOS PCT: 3 % (ref 0–5)
Eosinophils Absolute: 0.2 10*3/uL (ref 0.0–0.7)
HEMATOCRIT: 38 % (ref 36.0–46.0)
HEMOGLOBIN: 12.2 g/dL (ref 12.0–15.0)
Lymphocytes Relative: 30 % (ref 12–46)
Lymphs Abs: 2 10*3/uL (ref 0.7–4.0)
MCH: 28.9 pg (ref 26.0–34.0)
MCHC: 32.1 g/dL (ref 30.0–36.0)
MCV: 90 fL (ref 78.0–100.0)
MONO ABS: 0.5 10*3/uL (ref 0.1–1.0)
MONOS PCT: 8 % (ref 3–12)
NEUTROS ABS: 3.9 10*3/uL (ref 1.7–7.7)
Neutrophils Relative %: 58 % (ref 43–77)
Platelets: 227 10*3/uL (ref 150–400)
RBC: 4.22 MIL/uL (ref 3.87–5.11)
RDW: 13.1 % (ref 11.5–15.5)
WBC: 6.8 10*3/uL (ref 4.0–10.5)

## 2014-01-13 LAB — COMPREHENSIVE METABOLIC PANEL
ALBUMIN: 3.4 g/dL — AB (ref 3.5–5.2)
ALK PHOS: 80 U/L (ref 39–117)
ALT: 13 U/L (ref 0–35)
AST: 19 U/L (ref 0–37)
BUN: 11 mg/dL (ref 6–23)
CALCIUM: 9.2 mg/dL (ref 8.4–10.5)
CO2: 27 mEq/L (ref 19–32)
Chloride: 104 mEq/L (ref 96–112)
Creatinine, Ser: 0.88 mg/dL (ref 0.50–1.10)
GFR calc non Af Amer: 65 mL/min — ABNORMAL LOW (ref >90)
GFR, EST AFRICAN AMERICAN: 75 mL/min — AB (ref >90)
GLUCOSE: 113 mg/dL — AB (ref 70–99)
POTASSIUM: 4.1 meq/L (ref 3.7–5.3)
SODIUM: 142 meq/L (ref 137–147)
TOTAL PROTEIN: 6.6 g/dL (ref 6.0–8.3)
Total Bilirubin: 0.3 mg/dL (ref 0.3–1.2)

## 2014-01-13 LAB — HEMOGLOBIN A1C
HEMOGLOBIN A1C: 6.4 % — AB (ref <5.7)
Mean Plasma Glucose: 137 mg/dL — ABNORMAL HIGH (ref <117)

## 2014-01-13 LAB — GLUCOSE, CAPILLARY
Glucose-Capillary: 113 mg/dL — ABNORMAL HIGH (ref 70–99)
Glucose-Capillary: 136 mg/dL — ABNORMAL HIGH (ref 70–99)
Glucose-Capillary: 131 mg/dL — ABNORMAL HIGH (ref 70–99)

## 2014-01-13 LAB — LIPID PANEL
CHOL/HDL RATIO: 4.6 ratio
Cholesterol: 175 mg/dL (ref 0–200)
HDL: 38 mg/dL — AB (ref >39)
LDL CALC: 81 mg/dL (ref 0–99)
Triglycerides: 281 mg/dL — ABNORMAL HIGH (ref <150)
VLDL: 56 mg/dL — ABNORMAL HIGH (ref 0–40)

## 2014-01-13 LAB — SEDIMENTATION RATE: Sed Rate: 16 mm/hr (ref 0–22)

## 2014-01-13 MED ORDER — SODIUM CHLORIDE 0.9 % IV SOLN
INTRAVENOUS | Status: DC
  Administered 2014-01-13: 06:00:00 via INTRAVENOUS

## 2014-01-13 MED ORDER — ENOXAPARIN SODIUM 40 MG/0.4ML ~~LOC~~ SOLN
40.0000 mg | SUBCUTANEOUS | Status: DC
  Filled 2014-01-13: qty 0.4

## 2014-01-13 MED ORDER — PNEUMOCOCCAL VAC POLYVALENT 25 MCG/0.5ML IJ INJ: 0.5000 mL | INJECTION | INTRAMUSCULAR | Status: DC

## 2014-01-13 MED ORDER — LEVOTHYROXINE SODIUM 75 MCG PO TABS
75.0000 ug | ORAL_TABLET | Freq: Every day | ORAL | Status: DC
  Administered 2014-01-13: 75 ug via ORAL
  Filled 2014-01-13 (×2): qty 1

## 2014-01-13 MED ORDER — ATORVASTATIN CALCIUM 80 MG PO TABS
80.0000 mg | ORAL_TABLET | Freq: Every day | ORAL | Status: DC
  Filled 2014-01-13: qty 1

## 2014-01-13 MED ORDER — AMLODIPINE BESYLATE 5 MG PO TABS
5.0000 mg | ORAL_TABLET | Freq: Every day | ORAL | Status: DC
  Administered 2014-01-13: 5 mg via ORAL
  Filled 2014-01-13: qty 1

## 2014-01-13 MED ORDER — IRBESARTAN 150 MG PO TABS
150.0000 mg | ORAL_TABLET | Freq: Every day | ORAL | Status: DC
  Administered 2014-01-13: 150 mg via ORAL
  Filled 2014-01-13: qty 1

## 2014-01-13 MED ORDER — SENNOSIDES-DOCUSATE SODIUM 8.6-50 MG PO TABS
1.0000 | ORAL_TABLET | Freq: Every evening | ORAL | Status: DC | PRN
Start: 2014-01-13 — End: 2014-01-13

## 2014-01-13 MED ORDER — NAPHAZOLINE HCL 0.1 % OP SOLN
1.0000 [drp] | Freq: Four times a day (QID) | OPHTHALMIC | Status: DC | PRN
  Filled 2014-01-13: qty 15

## 2014-01-13 MED ORDER — ASPIRIN 300 MG RE SUPP: 300.0000 mg | Freq: Every day | RECTAL | Status: DC

## 2014-01-13 MED ORDER — NITROGLYCERIN 0.4 MG SL SUBL: 0.4000 mg | SUBLINGUAL_TABLET | SUBLINGUAL | Status: DC | PRN

## 2014-01-13 MED ORDER — OLMESARTAN-AMLODIPINE-HCTZ 20-5-12.5 MG PO TABS: 1.0000 | ORAL_TABLET | Freq: Every day | ORAL | Status: DC

## 2014-01-13 MED ORDER — ASPIRIN 325 MG PO TABS
325.0000 mg | ORAL_TABLET | Freq: Every day | ORAL | Status: DC
  Administered 2014-01-13: 325 mg via ORAL
  Filled 2014-01-13: qty 1

## 2014-01-13 MED ORDER — TRAMADOL HCL 50 MG PO TABS
50.0000 mg | ORAL_TABLET | Freq: Four times a day (QID) | ORAL | Status: DC | PRN
  Administered 2014-01-13: 50 mg via ORAL
  Filled 2014-01-13 (×2): qty 1

## 2014-01-13 MED ORDER — INSULIN ASPART 100 UNIT/ML ~~LOC~~ SOLN
0.0000 [IU] | Freq: Three times a day (TID) | SUBCUTANEOUS | Status: DC
  Administered 2014-01-13 (×2): 1 [IU] via SUBCUTANEOUS

## 2014-01-13 MED ORDER — ONDANSETRON HCL 4 MG/2ML IJ SOLN
4.0000 mg | Freq: Four times a day (QID) | INTRAMUSCULAR | Status: DC | PRN
  Administered 2014-01-13: 4 mg via INTRAVENOUS
  Filled 2014-01-13: qty 2

## 2014-01-13 MED ORDER — HYDROCHLOROTHIAZIDE 12.5 MG PO CAPS
12.5000 mg | ORAL_CAPSULE | Freq: Every day | ORAL | Status: DC
  Administered 2014-01-13: 12.5 mg via ORAL
  Filled 2014-01-13: qty 1

## 2014-01-13 MED ORDER — PANTOPRAZOLE SODIUM 40 MG PO TBEC
40.0000 mg | DELAYED_RELEASE_TABLET | Freq: Two times a day (BID) | ORAL | Status: DC
  Administered 2014-01-13: 40 mg via ORAL
  Filled 2014-01-13: qty 1

## 2014-01-13 MED ORDER — ASPIRIN 325 MG PO TABS: 325.0000 mg | ORAL_TABLET | ORAL | Status: DC

## 2014-01-13 MED ORDER — OXYBUTYNIN CHLORIDE ER 5 MG PO TB24
5.0000 mg | ORAL_TABLET | Freq: Every day | ORAL | Status: DC
  Administered 2014-01-13: 5 mg via ORAL
  Filled 2014-01-13: qty 1

## 2014-01-13 NOTE — H&P (Signed)
[facility] History and Physical  Caleigha Zale XVQ:[id number] DOB: 28-Jul-1946 DOA: 01/12/2014  Referring physician: ER physician. PCP: Pcp Not In System  Chief Complaint: Left eye vision disturbance and dizziness.  HPI: Katrina Swanson is a 68 y.o. female with history of diabetes mellitus, hypertension, hypothyroidism, hyperlipidemia, normal cardiac catheter in September 2014 presents with ER because patient was having symptoms of decreased vision on the left lower of the visual field on January 11, 2014. Patient states that initially patient felt dizzy and was staggering and walking around 8 PM on January 11, 2014. Following which patient had brief episode of visual disturbance in the lower half of the visual field of the left eye. Patient's symptoms lasted for around half hour. Following which her visual symptoms improved but patient had some headache just coming off and on. Throughout yesterday patient was feeling weak. Patient came to the ER later in the evening and had CT head which was negative for anything acute. Patient was seen by neurologist and admitted for further workup. Patient denies any chest pain short of breath nausea vomiting diarrhea fever chills neck pain. Still has mild headache on the left side temporal area. Patient has no visual disturbance at this time. Denies any difficulty speaking or swallowing or any weakness of the upper lower extremities.  Review of Systems: As presented in the history of presenting illness, rest negative.  Past Medical History  Diagnosis Date  . Stroke     2012  . Diabetes mellitus without complication   . Hypertension   . High cholesterol   . Thyroid disease     hypothyroid   Past Surgical History  Procedure Laterality Date  . Appendectomy    . Carpal tunnel release    . Cataracts    . Cardiac catheterization     Social History:  reports that she has never smoked. She does not have any smokeless tobacco history on file. She reports  that she does not drink alcohol or use illicit drugs. Where does patient live home. Can patient participate in ADLs? Yes.  No Known Allergies  Family History:  Family History  Problem Relation Age of Onset  . Diabetes Mellitus II Mother   . CAD Father   . Cancer Maternal Aunt       Prior to Admission medications   Medication Sig Start Date End Date Taking? Authorizing Provider  atorvastatin (LIPITOR) 80 MG tablet Take 80 mg by mouth at bedtime.    Yes Historical Provider, MD  Hypromellose (ARTIFICIAL TEARS OP) Place 1 drop into both eyes 4 (four) times daily as needed (dry eyes).   Yes Historical Provider, MD  levothyroxine (SYNTHROID, LEVOTHROID) 75 MCG tablet Take 75 mcg by mouth daily before breakfast.   Yes Historical Provider, MD  metFORMIN (GLUCOPHAGE) 500 MG tablet Take 1,000 mg by mouth 2 (two) times daily with a meal.    Yes Historical Provider, MD  nitroGLYCERIN (NITROSTAT) 0.4 MG SL tablet Place 0.4 mg under the tongue every 5 (five) minutes as needed for chest pain.   Yes Historical Provider, MD  Olmesartan-Amlodipine-HCTZ (TRIBENZOR) 20-5-12.5 MG TABS Take 1 tablet by mouth daily.   Yes Historical Provider, MD  oxybutynin (DITROPAN-XL) 5 MG 24 hr tablet Take 5 mg by mouth daily.   Yes Historical Provider, MD  pantoprazole (PROTONIX) 40 MG tablet Take 40 mg by mouth 2 (two) times daily before a meal.    Yes Historical Provider, MD  tetrahydrozoline-zinc (VISINE-AC) 0.05-0.25 % ophthalmic solution Place 1 drop  into both eyes 3 (three) times daily as needed (itching, redness).   Yes Historical Provider, MD  aspirin 325 MG tablet Take 325 mg by mouth 3 (three) times a week.     Historical Provider, MD    Physical Exam: Filed Vitals:   01/13/14 0200 01/13/14 0209 01/13/14 0430 01/13/14 0523  BP: 132/82 132/82 127/61 134/84  Pulse: 78 68 66 65  Temp:    98 F (36.7 C)  TempSrc:    Oral  Resp: 14 14 12 18   Height:    5\' 4"  (1.626 m)  Weight:    84.5 kg (186 lb 4.6 oz)   SpO2: 92% 96% 98% 97%     General:  Well-developed and nourished.  Eyes: Perla positive. Anicteric no pallor.  ENT: No discharge from ears eyes nose mouth.  Neck: No mass felt.  Cardiovascular: S1-S2 heard.  Respiratory: No rhonchi or crepitations.  Abdomen: Soft nontender bowel sounds present. No guarding or rigidity.  Skin: No rash.  Musculoskeletal: No edema.  Psychiatric: Appears normal.  Neurologic: Alert awake oriented to time place and person. Moves all extremities 5 x 5. No facial asymmetry. Tongue is midline.  Labs on Admission:  Basic Metabolic Panel:  Recent Labs Lab 01/12/14 2211  NA 141  K 4.0  CL 100  CO2 25  GLUCOSE 119*  BUN 11  CREATININE 0.94  CALCIUM 9.7   Liver Function Tests:  Recent Labs Lab 01/12/14 2211  AST 23  ALT 14  ALKPHOS 85  BILITOT <0.2*  PROT 7.3  ALBUMIN 3.7   No results found for this basename: LIPASE, AMYLASE,  in the last 168 hours No results found for this basename: AMMONIA,  in the last 168 hours CBC:  Recent Labs Lab 01/12/14 2211  WBC 7.7  NEUTROABS 4.3  HGB 12.9  HCT 39.9  MCV 90.7  PLT 256   Cardiac Enzymes: No results found for this basename: CKTOTAL, CKMB, CKMBINDEX, TROPONINI,  in the last 168 hours  BNP (last 3 results) No results found for this basename: PROBNP,  in the last 8760 hours CBG: No results found for this basename: GLUCAP,  in the last 168 hours  Radiological Exams on Admission: Ct Head Wo Contrast  01/13/2014   CLINICAL DATA:  Visual changes. Dizziness. Transient vision loss in the left thigh.  EXAM: CT HEAD WITHOUT CONTRAST  TECHNIQUE: Contiguous axial images were obtained from the base of the skull through the vertex without intravenous contrast.  COMPARISON:  04/26/2013 MRI.  FINDINGS: No mass lesion, mass effect, midline shift, hydrocephalus, hemorrhage. No acute territorial cortical ischemia/infarct. Atrophy and chronic ischemic white matter disease is present. Calvarium  intact. Paranasal sinuses appear within normal limits. Intracranial atherosclerosis. Scout images appear within normal limits.  IMPRESSION: Atrophy and chronic ischemic white matter disease without acute intracranial abnormality.   Electronically Signed   By: Dereck Ligas M.D.   On: 01/13/2014 00:42    EKG: Independently reviewed. Normal sinus rhythm with nonspecific ST-T changes.  Assessment/Plan Principal Problem:   CVA (cerebral infarction) Active Problems:   Unspecified hypothyroidism   Essential hypertension, benign   Amaurosis fugax of left eye   1. Possible CVA versus TIA - patient has been placed on neurochecks. Swallow evaluation. Get MRI/MRA brain 2-D echo carotid Doppler. Aspirin. 2. Diabetes mellitus - hold metformin for now and patient is on sliding-scale coverage. 3. Hypertension - continue present medications. 4. Hyperlipidemia - on statins. 5. Hypothyroidism - check TSH. Continue Synthroid.  Code Status: Full code.  Family Communication: None.  Disposition Plan: Admit to inpatient.    Orman Matsumura N. [facility] Pager [phone].  If 7PM-7AM, please contact night-coverage [email or web address] Password [facility] 01/13/2014, 5:44 AM

## 2014-01-13 NOTE — Discharge Summary (Signed)
Physician Discharge Summary  Katrina Swanson GUR:[id number] DOB: [birth year] DOA: 01/12/2014  PCP: Pcp Not In System  Admit date: 01/12/2014 Discharge date: 01/13/2014  Time spent: >35 minutes  Recommendations for Outpatient Follow-up:  F/u with PCP in 1 week  F/u with neurologist in 1-2 weeks  Discharge Diagnoses:  Principal Problem:   CVA (cerebral infarction) Active Problems:   Unspecified hypothyroidism   Essential hypertension, benign   Amaurosis fugax of left eye   Diabetes mellitus   Discharge Condition: stable   Diet recommendation: low sodium   Filed Weights   01/12/14 2028 01/13/14 0523  Weight: 83.915 kg (185 lb) 84.5 kg (186 lb 4.6 oz)    History of present illness:  68 y.o. female with history of diabetes mellitus, hypertension, hypothyroidism,h/o cva, hyperlipidemia, normal cardiac catheter in September 2014 presents with ER because patient was having symptoms of decreased vision on the left lower of the visual field on January 11, 2014. Patient states that initially patient felt dizzy and was staggering and walking around 8 PM on January 11, 2014. Following which patient had brief episode of visual disturbance in the lower half of the visual field of the left eye. Patient's symptoms lasted for around half hour. Following which her visual symptoms improved but patient had some headache just coming off and on.    Hospital Course:  1. L eye vision change, with headaches; ?TIA vs complicated migraine  -CT: Atrophy and chronic ischemic white matter disease without acute intracranial abnormality  -symptoms resolved, MRI: no acute infarct; , echo: No cardiac source of emboli was indentified. Carotids: Preliminary report: 1-39% ICA stenosis. Vertebral artery flow is antegrade  -cont ASA, statin, d/w Dr. Leonie Man, recommended to f/u at his clinic; neurology evaluation  2. DM cont ISS; pend HA1c; f/u with PCP in 1 week; d/w patient  3. HPL cont statin  4. Hypothyroidism, cont  levothyroxine    MRi:  No acute infarct.  Mild to moderate small vessel disease type changes.  MRA HEAD:  No medium or large size vessel significant stenosis or occlusion.  Tiny bulge basilar tip may represent a tiny aneurysm (1-2 mm).  Please see above.   Procedures: EchoStudy Conclusions  - Left ventricle: The cavity size was normal. Wall thickness was normal. Systolic function was normal. The estimated ejection fraction was in the range of 55% to 60%. Wall motion was normal; there were no regional wall motion abnormalities. Doppler parameters are consistent with abnormal left ventricular relaxation (grade 1 diastolic dysfunction). - Atrial septum: No defect or patent foramen ovale was identified.  Impressions:  - No cardiac source of emboli was indentified.     (i.e. Studies not automatically included, echos, thoracentesis, etc; not x-rays)  Consultations:  Neurology   Discharge Exam: Filed Vitals:   01/13/14 1215  BP: 140/97  Pulse: 81  Temp: 98.6 F (37 C)  Resp: 18    General: alert Cardiovascular: s1,s2 rrr Respiratory: CTA BL  Discharge Instructions  Discharge Instructions   Diet - low sodium heart healthy    Complete by:  As directed      Discharge instructions    Complete by:  As directed   Please follow up with primary care doctor in 1 week Please follow up with neurologist in 1-2 weeks     Increase activity slowly    Complete by:  As directed             Medication List         ARTIFICIAL  TEARS OP  Place 1 drop into both eyes 4 (four) times daily as needed (dry eyes).     aspirin 325 MG tablet  Take 325 mg by mouth 3 (three) times a week.     atorvastatin 80 MG tablet  Commonly known as:  LIPITOR  Take 80 mg by mouth at bedtime.     levothyroxine 75 MCG tablet  Commonly known as:  SYNTHROID, LEVOTHROID  Take 75 mcg by mouth daily before breakfast.     metFORMIN 500 MG tablet  Commonly known as:  GLUCOPHAGE  Take 1,000 mg by  mouth 2 (two) times daily with a meal.     nitroGLYCERIN 0.4 MG SL tablet  Commonly known as:  NITROSTAT  Place 0.4 mg under the tongue every 5 (five) minutes as needed for chest pain.     oxybutynin 5 MG 24 hr tablet  Commonly known as:  DITROPAN-XL  Take 5 mg by mouth daily.     pantoprazole 40 MG tablet  Commonly known as:  PROTONIX  Take 40 mg by mouth 2 (two) times daily before a meal.     tetrahydrozoline-zinc 0.05-0.25 % ophthalmic solution  Commonly known as:  VISINE-AC  Place 1 drop into both eyes 3 (three) times daily as needed (itching, redness).     TRIBENZOR 20-5-12.5 MG Tabs  Generic drug:  Olmesartan-Amlodipine-HCTZ  Take 1 tablet by mouth daily.       No Known Allergies     Follow-up Information   Follow up with Forbes Cellar, MD. Schedule an appointment as soon as possible for a visit in 1 week.   Specialties:  Neurology, Radiology   Contact information:   [address] [place] [address] [phone]       Follow up with Pcp Not In System.      Follow up with [facility]     In 1 week.   Contact information:   [place] [address] [phone]       The results of significant diagnostics from this hospitalization (including imaging, microbiology, ancillary and laboratory) are listed below for reference.    Significant Diagnostic Studies: Ct Head Wo Contrast  01/13/2014   CLINICAL DATA:  Visual changes. Dizziness. Transient vision loss in the left thigh.  EXAM: CT HEAD WITHOUT CONTRAST  TECHNIQUE: Contiguous axial images were obtained from the base of the skull through the vertex without intravenous contrast.  COMPARISON:  04/26/2013 MRI.  FINDINGS: No mass lesion, mass effect, midline shift, hydrocephalus, hemorrhage. No acute territorial cortical ischemia/infarct. Atrophy and chronic ischemic white matter disease is present. Calvarium intact. Paranasal sinuses appear within  normal limits. Intracranial atherosclerosis. Scout images appear within normal limits.  IMPRESSION: Atrophy and chronic ischemic white matter disease without acute intracranial abnormality.   Electronically Signed   By: Dereck Ligas M.D.   On: 01/13/2014 00:42   Mr Brain Wo Contrast  01/13/2014   CLINICAL DATA:  68 year old female with history of diabetes, high blood pressure and high cholesterol presenting with decreased vision left lower visual field 01/11/2014. Initial dizziness and difficulty walking. Symptoms have improved with intermittent headache.  EXAM: MRI HEAD WITHOUT CONTRAST  MRA HEAD WITHOUT CONTRAST  TECHNIQUE: Multiplanar, multiecho pulse sequences of the brain and surrounding structures were obtained without intravenous contrast. Angiographic images of the head were obtained using MRA technique without contrast.  COMPARISON:  01/13/2014 CT.  04/26/2013 MR.  FINDINGS: MRI HEAD FINDINGS  No acute  infarct.  No intracranial hemorrhage.  Mild to moderate small vessel disease type changes.  No hydrocephalus.  No intracranial mass lesion noted on this unenhanced exam.  Mild transverse ligament hypertrophy. Mild cervical spondylotic changes upper cervical spine. Cervical canal appears congenitally narrowed.  No focal orbital abnormality noted.  MRA HEAD FINDINGS  Anterior circulation without medium or large size vessel significant stenosis or occlusion.  Mild ectasia left internal carotid artery cavernous segment.  Mild irregularity middle cerebral artery branch vessels.  Left vertebral artery is dominant. No significant stenosis of the distal vertebral arteries or basilar artery.  Left posterior inferior cerebellar artery is not visualized.  Tiny bulge basilar tip may represent a tiny aneurysm (1-2 mm).  Left posterior cerebral artery distal branch vessel mild irregularity.  IMPRESSION: MRI HEAD:  No acute infarct.  Mild to moderate small vessel disease type changes.  MRA HEAD:  No medium or large  size vessel significant stenosis or occlusion.  Tiny bulge basilar tip may represent a tiny aneurysm (1-2 mm).  Please see above.   Electronically Signed   By: Chauncey Cruel M.D.   On: 01/13/2014 11:47   Mr Jodene Nam Head/brain Wo Cm  01/13/2014   CLINICAL DATA:  68 year old female with history of diabetes, high blood pressure and high cholesterol presenting with decreased vision left lower visual field 01/11/2014. Initial dizziness and difficulty walking. Symptoms have improved with intermittent headache.  EXAM: MRI HEAD WITHOUT CONTRAST  MRA HEAD WITHOUT CONTRAST  TECHNIQUE: Multiplanar, multiecho pulse sequences of the brain and surrounding structures were obtained without intravenous contrast. Angiographic images of the head were obtained using MRA technique without contrast.  COMPARISON:  01/13/2014 CT.  04/26/2013 MR.  FINDINGS: MRI HEAD FINDINGS  No acute infarct.  No intracranial hemorrhage.  Mild to moderate small vessel disease type changes.  No hydrocephalus.  No intracranial mass lesion noted on this unenhanced exam.  Mild transverse ligament hypertrophy. Mild cervical spondylotic changes upper cervical spine. Cervical canal appears congenitally narrowed.  No focal orbital abnormality noted.  MRA HEAD FINDINGS  Anterior circulation without medium or large size vessel significant stenosis or occlusion.  Mild ectasia left internal carotid artery cavernous segment.  Mild irregularity middle cerebral artery branch vessels.  Left vertebral artery is dominant. No significant stenosis of the distal vertebral arteries or basilar artery.  Left posterior inferior cerebellar artery is not visualized.  Tiny bulge basilar tip may represent a tiny aneurysm (1-2 mm).  Left posterior cerebral artery distal branch vessel mild irregularity.  IMPRESSION: MRI HEAD:  No acute infarct.  Mild to moderate small vessel disease type changes.  MRA HEAD:  No medium or large size vessel significant stenosis or occlusion.  Tiny bulge  basilar tip may represent a tiny aneurysm (1-2 mm).  Please see above.   Electronically Signed   By: Chauncey Cruel M.D.   On: 01/13/2014 11:47    Microbiology: No results found for this or any previous visit (from the past 240 hour(s)).   Labs: Basic Metabolic Panel:  Recent Labs Lab 01/12/14 2211 01/13/14 0625  NA 141 142  K 4.0 4.1  CL 100 104  CO2 25 27  GLUCOSE 119* 113*  BUN 11 11  CREATININE 0.94 0.88  CALCIUM 9.7 9.2   Liver Function Tests:  Recent Labs Lab 01/12/14 2211 01/13/14 0625  AST 23 19  ALT 14 13  ALKPHOS 85 80  BILITOT <0.2* 0.3  PROT 7.3 6.6  ALBUMIN 3.7 3.4*   No results found  for this basename: LIPASE, AMYLASE,  in the last 168 hours No results found for this basename: AMMONIA,  in the last 168 hours CBC:  Recent Labs Lab 01/12/14 2211 01/13/14 0625  WBC 7.7 6.8  NEUTROABS 4.3 3.9  HGB 12.9 12.2  HCT 39.9 38.0  MCV 90.7 90.0  PLT 256 227   Cardiac Enzymes: No results found for this basename: CKTOTAL, CKMB, CKMBINDEX, TROPONINI,  in the last 168 hours BNP: BNP (last 3 results) No results found for this basename: PROBNP,  in the last 8760 hours CBG:  Recent Labs Lab 01/13/14 0617 01/13/14 0917 01/13/14 1202  GLUCAP 113* 136* 131*       Signed:  Rowe Clack N  [facility] 01/13/2014, 2:09 PM

## 2014-01-13 NOTE — Consult Note (Signed)
Referring Physician: Greig Right    Chief Complaint: Transient visual abnormality involving left, and headache.  HPI: Katrina Swanson is an 68 y.o. female a history of previous stroke 3 years ago, diabetes mellitus, hypertension, hyperlipidemia and thyroid disease, presenting with history of transient visual change involving left eye lasting about 15 minutes. Episode occurred at 9 PM on 01/11/2014. She began experiencing a headache on 01/12/2014. Visual change involve the lower half of her vision. She had no associated symptoms, including no speech changes and no focal weakness or numbness. CT scan of her head showed no acute intracranial abnormality. NIH stroke score at the time of this evaluation was 0.  LSN: 9 PM on 01/11/2014 tPA Given: No: Deficits resolved MRankin: 0  Past Medical History  Diagnosis Date  . Stroke     2012  . Diabetes mellitus without complication   . Hypertension   . High cholesterol   . Thyroid disease     hypothyroid    History reviewed. No pertinent family history.   Medications: I have reviewed the patient's current medications.  ROS: History obtained from the patient  General ROS: negative for - chills, fatigue, fever, night sweats, weight gain or weight loss Psychological ROS: negative for - behavioral disorder, hallucinations, memory difficulties, mood swings or suicidal ideation Ophthalmic ROS: negative for - blurry vision, double vision, eye pain or loss of vision ENT ROS: negative for - epistaxis, nasal discharge, oral lesions, sore throat, tinnitus or vertigo Allergy and Immunology ROS: negative for - hives or itchy/watery eyes Hematological and Lymphatic ROS: negative for - bleeding problems, bruising or swollen lymph nodes Endocrine ROS: negative for - galactorrhea, hair pattern changes, polydipsia/polyuria or temperature intolerance Respiratory ROS: negative for - cough, hemoptysis, shortness of breath or wheezing Cardiovascular ROS:  negative for - chest pain, dyspnea on exertion, edema or irregular heartbeat Gastrointestinal ROS: negative for - abdominal pain, diarrhea, hematemesis, nausea/vomiting or stool incontinence Genito-Urinary ROS: negative for - dysuria, hematuria, incontinence or urinary frequency/urgency Musculoskeletal ROS: negative for - joint swelling or muscular weakness Neurological ROS: as noted in HPI Dermatological ROS: negative for rash and skin lesion changes  Physical Examination: Blood pressure 132/82, pulse 68, temperature 98.1 F (36.7 C), temperature source Oral, resp. rate 14, height 5\' 4"  (1.626 m), weight 83.915 kg (185 lb), SpO2 96.00%.  Neurologic Examination: Mental Status: Alert, oriented, thought content appropriate.  Speech fluent without evidence of aphasia. Able to follow commands without difficulty. Cranial Nerves: II-Visual fields were normal. III/IV/VI-Pupils were equal and reacted. Extraocular movements were full and conjugate.    V/VII-no facial numbness and no facial weakness. VIII-normal. X-normal speech and symmetrical palatal movement. Motor: 5/5 bilaterally with normal tone and bulk Sensory: Normal throughout. Deep Tendon Reflexes: 1+ and symmetric in upper extremities and absent at knees and ankles. Plantars: Mute bilaterally Cerebellar: Normal finger-to-nose testing. Carotid auscultation: Normal  Ct Head Wo Contrast  01/13/2014   CLINICAL DATA:  Visual changes. Dizziness. Transient vision loss in the left thigh.  EXAM: CT HEAD WITHOUT CONTRAST  TECHNIQUE: Contiguous axial images were obtained from the base of the skull through the vertex without intravenous contrast.  COMPARISON:  04/26/2013 MRI.  FINDINGS: No mass lesion, mass effect, midline shift, hydrocephalus, hemorrhage. No acute territorial cortical ischemia/infarct. Atrophy and chronic ischemic white matter disease is present. Calvarium intact. Paranasal sinuses appear within normal limits. Intracranial  atherosclerosis. Scout images appear within normal limits.  IMPRESSION: Atrophy and chronic ischemic white matter disease without acute intracranial abnormality.  Electronically Signed   By: Dereck Ligas M.D.   On: 01/13/2014 00:42    Assessment: 68 y.o. female with multiple risk factors for stroke presenting with probable transient left amaurosis fugax.  Stroke Risk Factors - diabetes mellitus, hyperlipidemia and hypertension  Plan: 1. HgbA1c, fasting lipid panel 2. MRI, MRA  of the brain without contrast 3. PT consult, OT consult, Speech consult 4. Echocardiogram 5. Carotid dopplers 6. Prophylactic therapy-Antiplatelet med: Aspirin  7. Risk factor modification 8. Telemetry monitoring   C.R. Nicole Kindred, MD [facility] [phone]  01/13/2014, 2:57 AM

## 2014-01-13 NOTE — Progress Notes (Signed)
[facility] PROGRESS NOTE  Edia Pursifull HGD:[id number] DOB: 1946/01/17 DOA: 01/12/2014 PCP: Pcp Not In System  Assessment/Plan: 68 y.o. female with history of diabetes mellitus, hypertension, hypothyroidism,h/o cva, hyperlipidemia, normal cardiac catheter in September 2014 presents with ER because patient was having symptoms of decreased vision on the left lower of the visual field on January 11, 2014. Patient states that initially patient felt dizzy and was staggering and walking around 8 PM on January 11, 2014. Following which patient had brief episode of visual disturbance in the lower half of the visual field of the left eye. Patient's symptoms lasted for around half hour. Following which her visual symptoms improved but patient had some headache just coming off and on.  1. L eye vision change, with headaches; ?TIA vs complicated migraine   -CT: Atrophy and chronic ischemic white matter disease without acute  intracranial abnormality -symptoms resolved, pend MRI, echo: No cardiac source of emboli was indentified. carotids:Preliminary report: 1-39% ICA stenosis. Vertebral artery flow is antegrade -cont ASA, statin, appreciate neurology evaluation   2. DM cont ISS; pend HA1c;  3. HPL cont statin  4. Hypothyroidism, cont levothyroxine      Code Status: full Family Communication: d/w patient (indicate person spoken with, relationship, and if by phone, the number) Disposition Plan: home pend CAV work up    Consultants:  Neurology   Procedures: Echo Study Conclusions  - Left ventricle: The cavity size was normal. Wall thickness was normal. Systolic function was normal. The estimated ejection fraction was in the range of 55% to 60%. Wall motion was normal; there were no regional wall motion abnormalities. Doppler parameters are consistent with abnormal left ventricular relaxation (grade 1 diastolic dysfunction). - Atrial septum: No defect or patent foramen ovale was  identified.  Impressions:  - No cardiac source of emboli was indentified.    Antibiotics:  none (indicate start date, and stop date if known)  HPI/Subjective: alert  Objective: Filed Vitals:   01/13/14 1015  BP: 138/90  Pulse: 66  Temp: 98.7 F (37.1 C)  Resp: 18   No intake or output data in the 24 hours ending 01/13/14 1050 Filed Weights   01/12/14 2028 01/13/14 0523  Weight: 83.915 kg (185 lb) 84.5 kg (186 lb 4.6 oz)    Exam:   General:  alert  Cardiovascular: s1,s2 rrr  Respiratory: CTA BL  Abdomen: soft, nt,nd  Musculoskeletal: no LE edema   Data Reviewed: Basic Metabolic Panel:  Recent Labs Lab 01/12/14 2211 01/13/14 0625  NA 141 142  K 4.0 4.1  CL 100 104  CO2 25 27  GLUCOSE 119* 113*  BUN 11 11  CREATININE 0.94 0.88  CALCIUM 9.7 9.2   Liver Function Tests:  Recent Labs Lab 01/12/14 2211 01/13/14 0625  AST 23 19  ALT 14 13  ALKPHOS 85 80  BILITOT <0.2* 0.3  PROT 7.3 6.6  ALBUMIN 3.7 3.4*   No results found for this basename: LIPASE, AMYLASE,  in the last 168 hours No results found for this basename: AMMONIA,  in the last 168 hours CBC:  Recent Labs Lab 01/12/14 2211 01/13/14 0625  WBC 7.7 6.8  NEUTROABS 4.3 3.9  HGB 12.9 12.2  HCT 39.9 38.0  MCV 90.7 90.0  PLT 256 227   Cardiac Enzymes: No results found for this basename: CKTOTAL, CKMB, CKMBINDEX, TROPONINI,  in the last 168 hours BNP (last 3 results) No results found for this basename: PROBNP,  in the last 8760 hours CBG:  Recent  Labs Lab 01/13/14 0617 01/13/14 0917  GLUCAP 113* 136*    No results found for this or any previous visit (from the past 240 hour(s)).   Studies: Ct Head Wo Contrast  01/13/2014   CLINICAL DATA:  Visual changes. Dizziness. Transient vision loss in the left thigh.  EXAM: CT HEAD WITHOUT CONTRAST  TECHNIQUE: Contiguous axial images were obtained from the base of the skull through the vertex without intravenous contrast.  COMPARISON:   04/26/2013 MRI.  FINDINGS: No mass lesion, mass effect, midline shift, hydrocephalus, hemorrhage. No acute territorial cortical ischemia/infarct. Atrophy and chronic ischemic white matter disease is present. Calvarium intact. Paranasal sinuses appear within normal limits. Intracranial atherosclerosis. Scout images appear within normal limits.  IMPRESSION: Atrophy and chronic ischemic white matter disease without acute intracranial abnormality.   Electronically Signed   By: Dereck Ligas M.D.   On: 01/13/2014 00:42    Scheduled Meds: . amLODipine  5 mg Oral Daily  . aspirin  300 mg Rectal Daily   Or  . aspirin  325 mg Oral Daily  . atorvastatin  80 mg Oral QHS  . enoxaparin (LOVENOX) injection  40 mg Subcutaneous Q24H  . hydrochlorothiazide  12.5 mg Oral Daily  . insulin aspart  0-9 Units Subcutaneous TID WC  . irbesartan  150 mg Oral Daily  . levothyroxine  75 mcg Oral QAC breakfast  . oxybutynin  5 mg Oral Daily  . pantoprazole  40 mg Oral BID AC  . [START ON 01/14/2014] pneumococcal 23 valent vaccine  0.5 mL Intramuscular Tomorrow-1000   Continuous Infusions: . sodium chloride 50 mL/hr at 01/13/14 0554    Principal Problem:   CVA (cerebral infarction) Active Problems:   Unspecified hypothyroidism   Essential hypertension, benign   Amaurosis fugax of left eye   Diabetes mellitus    Time spent: >35 minutes     Kinnie Feil  [facility] Pager [phone]. If 7PM-7AM, please contact night-coverage at [email or web address], password [facility] 01/13/2014, 10:50 AM  LOS: 1 day

## 2014-01-13 NOTE — Progress Notes (Signed)
Echo Lab  2D Echocardiogram completed.  Corley Kohls L Lennis Korb, RDCS 01/13/2014 8:10 AM

## 2014-01-13 NOTE — Progress Notes (Signed)
PT Cancellation Note  Patient Details Name: Swara Donze MRN: [id number] DOB: 05-29-1946   Cancelled Treatment:    Reason Eval/Treat Not Completed: Patient at procedure or test/unavailable;Other (comment) (PT order to start tomorrow, will check with neuro to see if she is ok to see today.  Thanks,   Barbarann Ehlers. [place], [place], DPT [phone]   01/13/2014, 10:30 AM

## 2014-01-13 NOTE — Progress Notes (Signed)
Stroke Team Progress Note  HISTORY Chief Complaint: Transient visual abnormality involving left, and headache.   HPI: Katrina Swanson is an 68 y.o. female with a history of previous stroke 3 years ago, diabetes mellitus, hypertension, hyperlipidemia and thyroid disease, presenting with history of transient visual change involving left eye lasting about 15 minutes. Episode occurred at 9 PM on 01/11/2014. She began experiencing a headache on 01/12/2014. Visual change involve the lower half of her vision. She had no associated symptoms, including no speech changes and no focal weakness or numbness. CT scan of her head showed no acute intracranial abnormality. NIH stroke score at the time of this evaluation was 0.  LSN: 9 PM on 01/11/2014   Patient was not administered TPA secondary to deficits resolved on arrival. She was admitted to the Internal Medicine Service for further evaluation and treatment. Neurology is consulting.   SUBJECTIVE No acute events overnight. Patient is alert and conversant. She states that on the evening of 01/11/14 she experienced a "bad dizzy spell" lasting about 1 hour, which she thought was vertigo and for which she took meclizine. She then became nauseated and and realized she could only see the top half of her visual field in her left eye. She went to bed, and woke up 01/12/14 feeling tired and "swimmy headed". She felt worse and worse over the course of the day and came to the hospital. Today she states she feels completely back to normal, other than complaining of a headache 5/10 for the past few hours.   OBJECTIVE Most recent Vital Signs: Filed Vitals:   01/13/14 0200 01/13/14 0209 01/13/14 0430 01/13/14 0523  BP: 132/82 132/82 127/61 134/84  Pulse: 78 68 66 65  Temp:    98 F (36.7 C)  TempSrc:    Oral  Resp: 14 14 12 18   Height:    5\' 4"  (1.626 m)  Weight:    84.5 kg (186 lb 4.6 oz)  SpO2: 92% 96% 98% 97%   CBG (last 3)   Recent Labs  01/13/14 0617  GLUCAP  113*    IV Fluid Intake:   . sodium chloride 50 mL/hr at 01/13/14 0554    MEDICATIONS  . amLODipine  5 mg Oral Daily  . aspirin  300 mg Rectal Daily   Or  . aspirin  325 mg Oral Daily  . atorvastatin  80 mg Oral QHS  . enoxaparin (LOVENOX) injection  40 mg Subcutaneous Q24H  . hydrochlorothiazide  12.5 mg Oral Daily  . insulin aspart  0-9 Units Subcutaneous TID WC  . irbesartan  150 mg Oral Daily  . levothyroxine  75 mcg Oral QAC breakfast  . oxybutynin  5 mg Oral Daily  . pantoprazole  40 mg Oral BID AC  . [START ON 01/14/2014] pneumococcal 23 valent vaccine  0.5 mL Intramuscular Tomorrow-1000   PRN:  naphazoline, nitroGLYCERIN, senna-docusate, traMADol  Diet:  Heart Healthy/Carb modified thin liquids Activity:  As tolerated DVT Prophylaxis:  Lovenox  CLINICALLY SIGNIFICANT STUDIES Basic Metabolic Panel:  Recent Labs Lab 01/12/14 2211 01/13/14 0625  NA 141 142  K 4.0 4.1  CL 100 104  CO2 25 27  GLUCOSE 119* 113*  BUN 11 11  CREATININE 0.94 0.88  CALCIUM 9.7 9.2   Liver Function Tests:  Recent Labs Lab 01/12/14 2211 01/13/14 0625  AST 23 19  ALT 14 13  ALKPHOS 85 80  BILITOT <0.2* 0.3  PROT 7.3 6.6  ALBUMIN 3.7 3.4*   CBC:  Recent  Labs Lab 01/12/14 2211 01/13/14 0625  WBC 7.7 6.8  NEUTROABS 4.3 3.9  HGB 12.9 12.2  HCT 39.9 38.0  MCV 90.7 90.0  PLT 256 227   Coagulation:  Recent Labs Lab 01/12/14 2211  LABPROT 12.2  INR 0.92   Cardiac Enzymes: No results found for this basename: CKTOTAL, CKMB, CKMBINDEX, TROPONINI,  in the last 168 hours Urinalysis:  Recent Labs Lab 01/12/14 2213  COLORURINE YELLOW  LABSPEC 1.015  PHURINE 5.0  GLUCOSEU NEGATIVE  HGBUR NEGATIVE  BILIRUBINUR NEGATIVE  KETONESUR NEGATIVE  PROTEINUR NEGATIVE  UROBILINOGEN 0.2  NITRITE NEGATIVE  LEUKOCYTESUR TRACE*   Lipid Panel    Component Value Date/Time   CHOL 175 01/13/2014 1251   HgbA1C  No results found for this basename: HGBA1C    Urine Drug Screen:      Component Value Date/Time   LABOPIA NONE DETECTED 01/12/2014 2213   COCAINSCRNUR NONE DETECTED 01/12/2014 2213   LABBENZ NONE DETECTED 01/12/2014 2213   AMPHETMU NONE DETECTED 01/12/2014 2213   THCU NONE DETECTED 01/12/2014 2213   LABBARB NONE DETECTED 01/12/2014 2213    Alcohol Level:  Recent Labs Lab 01/12/14 2211  ETH <11    Ct Head Wo Contrast  01/13/2014  IMPRESSION: Atrophy and chronic ischemic white matter disease without acute intracranial abnormality.      MRI/MRA head: 01/13/14 IMPRESSION:  MRI HEAD:  No acute infarct.  Mild to moderate small vessel disease type changes.  MRA HEAD:  No medium or large size vessel significant stenosis or occlusion.  Tiny bulge basilar tip may represent a tiny aneurysm (1-2 mm).  2D Echocardiogram pending  Carotid Doppler 01/13/14  Preliminary: <39% ICA stenosis bilat  EKG 01/12/14 Sinus rhythm. For complete results please see formal report.   Therapy Recommendations no ongoing needs identified  Physical ExamGen: Patient is well developed, obese elderly woman in no acute distress.  Cardiac: RRR. S1S2 audible. No M/R/G.  Extremities: Cap refill <2 secs. No cyanosis or edema. Pulses 2+ radial and DP. Pulmonary: Respirations regular, symmetric. Lungs clear to auscultation bilat. Abd: Soft, non-tender. BS audible x 4 quadrants.  G/U: Deferred  MS: Alert, follows commands. Oriented to person, place, time, and event. Speech: Speech fluent and non-dysarthric. Able to name and repeat. No alexia or agraphia.   CN: No visual field cut. PERRL. Questionable R partial 6th nerve palsy (R eye does not fully abduct with R gaze), otherwise EOMs intact. Facial sensation intact V1-3. No facial droop. Hearing grossly intact. Strong cough. Sternocleidomastoids and trapezius 5/5 strength. Tongue midline, full strength, no atrophy or fasciculations.  Strength: 5/5 in all four extremities proximally and distally.  Sensation: Intact to light touch in all four  extremities.  Coordination: No ataxia or dysmetria on FTN or HTS bilat. Gait steady.   Proprioception: Did not assess Romberg.   Reflexes: 2+ biceps, brachioradialis bilat. 2+ patellar, achilles bilat.   NIHSS 0   ASSESSMENT/PLAN Ms. Katrina Swanson is a 68 y.o. female with prior history of multiple stroke risk factors including diabetes mellitus, hypertension, hyperlipidemia, hypothyroidism, and previous stroke 3 years ago presented 01/12/14 with history of dizziness, nausea, and transient visual change involving left eye. Did not receive IV tPA due to symptoms resolved on arrival. Imaging shows small vessel ischemic disease and a 1-2 mm bulge at the basilar tip, but no acute infarct. On aspirin 325 mg orally every day prior to admission. Now on aspirin 325 mg orally every day for secondary stroke prevention. Patient back to baseline. TIA  work up underway.  Probable TIA vs Complicated Migraine  Imaging shows no acute infarct  Continue ASA 325 for antiplatelet therapy  Echo pending  Carotid Doppler Preliminary: <39% ICA stenosis bilat  Hyperlipidemia  LDL 81, goal <70 due to history of DM and prior stroke  Continue atorvastatin 80 mg   Hypertension  On olmesartan-HCTZ-amlodipine at home, not on formulary  Continue HCTZ 12/5 mg daily, irbesartan 150 mg daily, amlodipine 5 mg daily while hospitalized  Diabetes  On metformin at home  On SSI while hospitalized  HgbA1C pending  Hypothyroidism  On levothyroxine 75 mg daily at home  Continue during hospitalization  Last [facility] 9/27/214 0.929  Hospital day # 1  SIGNED Delbert Phenix, MSN, ANP-C, [place], MSCS [place] Stroke Team [phone]  I have personally obtained a history, examined the patient, evaluated imaging results, and formulated the assessment and plan of care. I agree with the above. Antony Contras, MD   To contact Stroke Continuity provider, please refer to [email or web address] After hours, contact General  Neurology

## 2014-01-13 NOTE — Evaluation (Addendum)
Physical Therapy Evaluation Patient Details Name: Katrina Swanson MRN: [id number] DOB: 22-Jul-1946 Today's Date: 01/13/2014   History of Present Illness  68 y.o. female admitted to [facility] on 01/12/14 with L eye vision changes and dizziness.  Stroke workup in progress.  CT was negative for acute event.  MRI also negative.  Suspected TIA.  Pt with significant PMhx of stroke in 2012, DM, and HTN.   Clinical Impression  Spoke with neuro MD who gave PT verbal permission to see pt today as orders were for PT to start tomorrow 01/14/14.  Pt is moving well and at her normal baseline level of function.  She scored well on DGI walking balance test and showed proficiency on the stairs.  She reports all of the symptoms she was having PTA have resolved.  She has no further acute PT needs.  PT screened for OT needs.  No OT needs identified, so PT signed off for OT as well.      Follow Up Recommendations No PT follow up    Equipment Recommendations  None recommended by PT    Recommendations for Other Services   NA    Precautions / Restrictions Precautions Precautions: None      Mobility  Bed Mobility Overal bed mobility: Independent                Transfers Overall transfer level: Independent Equipment used: None                Ambulation/Gait Ambulation/Gait assistance: Independent Ambulation Distance (Feet): 250 Feet Assistive device: None          Stairs Stairs: Yes Stairs assistance: Modified independent (Device/Increase time) Stair Management: One rail Left;Alternating pattern;Forwards Number of Stairs: 5        Modified Rankin (Stroke Patients Only) Modified Rankin (Stroke Patients Only) Pre-Morbid Rankin Score: No symptoms Modified Rankin: No symptoms     Balance                                 Standardized Balance Assessment Standardized Balance Assessment : Dynamic Gait Index   Dynamic Gait Index Level Surface: Normal Change in Gait  Speed: Normal Gait with Horizontal Head Turns: Normal Gait with Vertical Head Turns: Normal Gait and Pivot Turn: Normal Step Over Obstacle: Normal Step Around Obstacles: Normal Steps: Normal Total Score: 24       Pertinent Vitals/Pain See vitals flow sheet.     Home Living Family/patient expects to be discharged to:: Private residence Living Arrangements: Spouse/significant other Available Help at Discharge: Family Type of Home: House Home Access: Stairs to enter Entrance Stairs-Rails: None Entrance Stairs-Number of Steps: 1 Home Layout: One level Home Equipment: Environmental consultant - 4 wheels;Walker - standard;Cane - single point;Bedside commode      Prior Function Level of Independence: Independent         Comments: Pt reports she only used RW for a short period of time after her other stroke. PTA, she did not use an assistive device, she still drives, but does not work.       Hand Dominance   Dominant Hand: Right    Extremity/Trunk Assessment   Upper Extremity Assessment: Overall WFL for tasks assessed (good strength, coordination, and sensation)           Lower Extremity Assessment: Overall WFL for tasks assessed (Good strength, coordination, and sensation)      Cervical / Trunk Assessment: Normal  Communication  Communication: No difficulties  Cognition Arousal/Alertness: Awake/alert Behavior During Therapy: WFL for tasks assessed/performed Overall Cognitive Status: Within Functional Limits for tasks assessed                      General Comments General comments (skin integrity, edema, etc.): Oculomotor testing seemed WNL. Of note, per pt report with her previous stroke she had a field cut where she could not see out of the lower half of her eye which resolved with time.  She does report some motion sensativity while walking and turning her head horizontally, but stated this was also present PTA and was normal for her after her previous stroke.             Assessment/Plan    PT Assessment Patent does not need any further PT services           PT Goals (Current goals can be found in the Care Plan section) Acute Rehab PT Goals Patient Stated Goal: to go home PT Goal Formulation: No goals set, d/c therapy               End of Session   Activity Tolerance: Patient tolerated treatment well Patient left: in bed;with call bell/phone within reach;with family/visitor present           Time: 1152-1207 PT Time Calculation (min): 15 min   Charges:   PT Evaluation $Initial PT Evaluation Tier I: 1 Procedure PT Treatments $Gait Training: 8-22 mins        Tirsa Gail B. [place], [place], DPT [phone]   01/13/2014, 2:24 PM

## 2014-01-13 NOTE — Evaluation (Signed)
Clinical/Bedside Swallow Evaluation Patient Details  Name: Katrina Swanson MRN: [id number] Date of Birth: 17-Oct-1945  Today's Date: 01/13/2014 Time: 0810-0832 SLP Time Calculation (min): 22 min  Past Medical History:  Past Medical History  Diagnosis Date  . Stroke     2012  . Diabetes mellitus without complication   . Hypertension   . High cholesterol   . Thyroid disease     hypothyroid   Past Surgical History:  Past Surgical History  Procedure Laterality Date  . Appendectomy    . Carpal tunnel release    . Cataracts    . Cardiac catheterization     HPI:  68 yo female adm to [facility] with left eye vision changes and dizziness. PMH + for CVA 2012, DM, HTN, sleep apnea s/p surgery.  CT negative.  MRI for today.  Pt for BSE due to h/o transient dysphagia resulting in occasional nasal regurgitation of food/drink after sleep apnea surgery per pt.     Assessment / Plan / Recommendation Clinical Impression  Functional oropharyngeal swallow ability without CN deficits nor indication of airway compromise.  Transient dysphagia in the past due to sleep apnea surgery resulting in nasal regurgitation of liquids/foods on occasion that resolved per pt.  Pt appears with shortened uvula but no nasal regurgitation noted today and swallow was timely.  SLP assisted pt with dental brushing and advised her to importance of oral care for pulmonary health.    Recommend regular/thin consistency with general aspiration precautions.    Speech and language abilities screened and no needs identified - pt in agreement.  SLP to dc, please reorder if desire.     Aspiration Risk  Mild    Diet Recommendation Regular;Thin liquid   Medication Administration: Whole meds with liquid (pt denies issues swallowing pills) Supervision: Patient able to self feed Compensations: Slow rate;Small sips/bites Postural Changes and/or Swallow Maneuvers: Seated upright 90 degrees    Other  Recommendations Oral Care  Recommendations: Oral care BID   Follow Up Recommendations    n/a   Frequency and Duration   n/a     Pertinent Vitals/Pain Afebrile, decreased     Swallow Study Prior Functional Status    see [place] Date of Onset: 01/13/14 HPI: 68 yo female adm to [facility] with left eye vision changes and dizziness. PMH + for CVA 2012, DM, HTN, sleep apnea s/p surgery.  CT negative.  MRI for today.  Pt for BSE due to h/o transient dysphagia resulting in occasional nasal regurgitation of food/drink after sleep apnea surgery per pt.   Type of Study: Bedside swallow evaluation Diet Prior to this Study: NPO Temperature Spikes Noted: No Respiratory Status: Room air History of Recent Intubation: No Behavior/Cognition: Alert;Cooperative;Pleasant mood Oral Cavity - Dentition: Adequate natural dentition Self-Feeding Abilities: Able to feed self Patient Positioning: Upright in bed Baseline Vocal Quality: Clear Volitional Cough: Strong Volitional Swallow: Able to elicit    Oral/Motor/Sensory Function Overall Oral Motor/Sensory Function: Appears within functional limits for tasks assessed Velum:  (shortened uvula from sleep apnea surgery)   Ice Chips Ice chips: Not tested   Thin Liquid Thin Liquid: Within functional limits Presentation: Cup    Nectar Thick Nectar Thick Liquid: Not tested   Honey Thick Honey Thick Liquid: Not tested   Puree Puree: Within functional limits Presentation: Self Fed;Spoon   Solid   GO    Solid: Within functional limits Presentation: [place], Janett Labella, MS  [facility] SLP [phone]

## 2014-01-13 NOTE — Progress Notes (Signed)
VASCULAR LAB PRELIMINARY  PRELIMINARY  PRELIMINARY  PRELIMINARY  Carotid Dopplers completed.    Preliminary report:  1-39% ICA stenosis.  Vertebral artery flow is antegrade.  Delories Mauri, RVT 01/13/2014, 7:58 AM

## 2014-01-14 ENCOUNTER — Telehealth: Payer: Self-pay | Admitting: *Deleted

## 2014-01-14 NOTE — Progress Notes (Signed)
UR complete.  Courtney Robarge RN, MSN 

## 2014-01-15 NOTE — Telephone Encounter (Signed)
Pt has a f/u with Dr. Erlinda Hong on 02/11/14.

## 2014-01-22 ENCOUNTER — Ambulatory Visit: Payer: Self-pay | Admitting: Diagnostic Neuroimaging

## 2014-02-11 ENCOUNTER — Other Ambulatory Visit (INDEPENDENT_AMBULATORY_CARE_PROVIDER_SITE_OTHER): Payer: Self-pay

## 2014-02-11 ENCOUNTER — Encounter: Payer: Self-pay | Admitting: Neurology

## 2014-02-11 ENCOUNTER — Ambulatory Visit (INDEPENDENT_AMBULATORY_CARE_PROVIDER_SITE_OTHER): Payer: Medicare Other | Admitting: Neurology

## 2014-02-11 VITALS — BP 130/90 | HR 78 | Ht 63.5 in | Wt 189.0 lb

## 2014-02-11 DIAGNOSIS — Z8673 Personal history of transient ischemic attack (TIA), and cerebral infarction without residual deficits: Secondary | ICD-10-CM | POA: Insufficient documentation

## 2014-02-11 DIAGNOSIS — Z0289 Encounter for other administrative examinations: Secondary | ICD-10-CM

## 2014-02-11 DIAGNOSIS — E039 Hypothyroidism, unspecified: Secondary | ICD-10-CM

## 2014-02-11 DIAGNOSIS — G459 Transient cerebral ischemic attack, unspecified: Secondary | ICD-10-CM | POA: Insufficient documentation

## 2014-02-11 NOTE — Progress Notes (Signed)
STROKE NEUROLOGY FOLLOW UP NOTE  NAME: Katrina Swanson DOB: June 01, 1946  REASON FOR VISIT: Stroke followup HISTORY FROM: Patient  Today we had the pleasure of seeing Caroljean Christley in follow-up at our Neurology Clinic. Pt was accompanied by  No one. Interval history was obtained from patient  History Summary 68 year old [place] American female with cosmetic history of hypertension diabetes hyperlipidemia hypothyroidism followup in clinic for her TIA.   she stated that she had stroke 3 years ago at that time she was in and the sheet loss of balance she has assessed that him for minutes and she checked her sugar levels the time was fine but pressure was okay she got out of chair and almost fall difficult walking was staggering but she noticed that she's not able to see out of her left eye she went to the hospital to [facility] was noticed that it she has some ice dislocated but no double vision as she can remember she states is radiating up his hospital was told to have a posterior stroke but without any neurological deficit residual.   She was admitted mid-june this year to [facility] hospital for left vision disturbance. At that time she was about to go to bed, however she noted the sudden onset of left eye lower visual field blurry vision but not total black out. Left eye upper visual field was okay and right eye visual field were okay. The visual disturbance only last about 15 minutes and resolved. She woke up the second day, she felt headache on the left side of her head and face so she came over to our hospital for evaluation. She had no social symptoms including no speech changes no focal weakness or numbness CT of the head did not show any acute intracranial abnormality in MRI didn't show acute stroke but recent mild to moderate small vessel right matter changes. 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 She had a carotid Doppler which was  unremarkable and a 2-D echo was unremarkable. Her LDL was 81, A1c 6.4. She was discharged with aspirin and Lipitor.  Interval History During the interval time, the patient has been doing well.  she had no recurrent visual disturbance. No weakness numbness or speech difficulties. However she stated that she still has mild bi-temporal headache every day for the last 2 weeks she used to have headache like this every 2 or 3 months. HA is achy pain no N/V and no photophobia phonophobia.  She stated that she had a headache for the last 12 years which were controlled very good. she has a glucose monitor device at home for monitoring. she has HTN and on home medications and her blood pressure was well controlled systolic blood pressure around 100-120. She also has HDL and she is on statins. she states that she is compliant with medication. She denies any shoulder pain going pain but she does in those that she frequently feels fatigue and tiredness.  She has no medication changes. She still takes aspirin 325 and Lipitor 80 mg as well as metformin levels proximal and and the blood pressure medication including olmesartan amlodipine and HCTZ combination.  Current Outpatient Prescriptions on File Prior to Visit  Medication Sig Dispense Refill  . aspirin 325 MG tablet Take 325 mg by mouth 3 (three) times a week.       Marland Kitchen atorvastatin (LIPITOR) 80 MG tablet Take 80 mg by mouth at bedtime.       Marland Kitchen  Hypromellose (ARTIFICIAL TEARS OP) Place 1 drop into both eyes 4 (four) times daily as needed (dry eyes).      Marland Kitchen levothyroxine (SYNTHROID, LEVOTHROID) 75 MCG tablet Take 75 mcg by mouth daily before breakfast.      . metFORMIN (GLUCOPHAGE) 500 MG tablet Take 1,000 mg by mouth 2 (two) times daily with a meal.       . nitroGLYCERIN (NITROSTAT) 0.4 MG SL tablet Place 0.4 mg under the tongue every 5 (five) minutes as needed for chest pain.      . Olmesartan-Amlodipine-HCTZ (TRIBENZOR) 20-5-12.5 MG TABS Take 1 tablet by mouth  daily.      Marland Kitchen oxybutynin (DITROPAN-XL) 5 MG 24 hr tablet Take 5 mg by mouth daily.      . pantoprazole (PROTONIX) 40 MG tablet Take 40 mg by mouth 2 (two) times daily before a meal.       . tetrahydrozoline-zinc (VISINE-AC) 0.05-0.25 % ophthalmic solution Place 1 drop into both eyes 3 (three) times daily as needed (itching, redness).       No current facility-administered medications on file prior to visit.    The following represents the patient's updated allergies and side effects list: No Known Allergies  Labs since last visit of relevance include the following: Results for orders placed during the hospital encounter of 01/12/14  ETHANOL      Result Value Ref Range   Alcohol, Ethyl (B) <11  0 - 11 mg/dL  PROTIME-INR      Result Value Ref Range   Prothrombin Time 12.2  11.6 - 15.2 seconds   INR 0.92  0.00 - 1.49  APTT      Result Value Ref Range   aPTT 33  24 - 37 seconds  CBC      Result Value Ref Range   WBC 7.7  4.0 - 10.5 K/uL   RBC 4.40  3.87 - 5.11 MIL/uL   Hemoglobin 12.9  12.0 - 15.0 g/dL   HCT 39.9  36.0 - 46.0 %   MCV 90.7  78.0 - 100.0 fL   MCH 29.3  26.0 - 34.0 pg   MCHC 32.3  30.0 - 36.0 g/dL   RDW 13.1  11.5 - 15.5 %   Platelets 256  150 - 400 K/uL  DIFFERENTIAL      Result Value Ref Range   Neutrophils Relative % 55  43 - 77 %   Neutro Abs 4.3  1.7 - 7.7 K/uL   Lymphocytes Relative 32  12 - 46 %   Lymphs Abs 2.5  0.7 - 4.0 K/uL   Monocytes Relative 8  3 - 12 %   Monocytes Absolute 0.6  0.1 - 1.0 K/uL   Eosinophils Relative 4  0 - 5 %   Eosinophils Absolute 0.3  0.0 - 0.7 K/uL   Basophils Relative 1  0 - 1 %   Basophils Absolute 0.1  0.0 - 0.1 K/uL  COMPREHENSIVE METABOLIC PANEL      Result Value Ref Range   Sodium 141  137 - 147 mEq/L   Potassium 4.0  3.7 - 5.3 mEq/L   Chloride 100  96 - 112 mEq/L   CO2 25  19 - 32 mEq/L   Glucose, Bld 119 (*) 70 - 99 mg/dL   BUN 11  6 - 23 mg/dL   Creatinine, Ser 0.94  0.50 - 1.10 mg/dL   Calcium 9.7  8.4 -  10.5 mg/dL   Total Protein 7.3  6.0 - 8.3  g/dL   Albumin 3.7  3.5 - 5.2 g/dL   AST 23  0 - 37 U/L   ALT 14  0 - 35 U/L   Alkaline Phosphatase 85  39 - 117 U/L   Total Bilirubin <0.2 (*) 0.3 - 1.2 mg/dL   GFR calc non Af Amer 60 (*) >90 mL/min   GFR calc Af Amer 70 (*) >90 mL/min  URINE RAPID DRUG SCREEN (HOSP PERFORMED)      Result Value Ref Range   Opiates NONE DETECTED  NONE DETECTED   Cocaine NONE DETECTED  NONE DETECTED   Benzodiazepines NONE DETECTED  NONE DETECTED   Amphetamines NONE DETECTED  NONE DETECTED   Tetrahydrocannabinol NONE DETECTED  NONE DETECTED   Barbiturates NONE DETECTED  NONE DETECTED  URINALYSIS, ROUTINE W REFLEX MICROSCOPIC      Result Value Ref Range   Color, Urine YELLOW  YELLOW   APPearance CLEAR  CLEAR   Specific Gravity, Urine 1.015  1.005 - 1.030   pH 5.0  5.0 - 8.0   Glucose, UA NEGATIVE  NEGATIVE mg/dL   Hgb urine dipstick NEGATIVE  NEGATIVE   Bilirubin Urine NEGATIVE  NEGATIVE   Ketones, ur NEGATIVE  NEGATIVE mg/dL   Protein, ur NEGATIVE  NEGATIVE mg/dL   Urobilinogen, UA 0.2  0.0 - 1.0 mg/dL   Nitrite NEGATIVE  NEGATIVE   Leukocytes, UA TRACE (*) NEGATIVE  URINE MICROSCOPIC-ADD ON      Result Value Ref Range   Squamous Epithelial / LPF RARE  RARE   WBC, UA 0-2  <3 WBC/hpf   Bacteria, UA RARE  RARE  COMPREHENSIVE METABOLIC PANEL      Result Value Ref Range   Sodium 142  137 - 147 mEq/L   Potassium 4.1  3.7 - 5.3 mEq/L   Chloride 104  96 - 112 mEq/L   CO2 27  19 - 32 mEq/L   Glucose, Bld 113 (*) 70 - 99 mg/dL   BUN 11  6 - 23 mg/dL   Creatinine, Ser 0.88  0.50 - 1.10 mg/dL   Calcium 9.2  8.4 - 10.5 mg/dL   Total Protein 6.6  6.0 - 8.3 g/dL   Albumin 3.4 (*) 3.5 - 5.2 g/dL   AST 19  0 - 37 U/L   ALT 13  0 - 35 U/L   Alkaline Phosphatase 80  39 - 117 U/L   Total Bilirubin 0.3  0.3 - 1.2 mg/dL   GFR calc non Af Amer 65 (*) >90 mL/min   GFR calc Af Amer 75 (*) >90 mL/min  CBC WITH DIFFERENTIAL      Result Value Ref Range   WBC 6.8   4.0 - 10.5 K/uL   RBC 4.22  3.87 - 5.11 MIL/uL   Hemoglobin 12.2  12.0 - 15.0 g/dL   HCT 38.0  36.0 - 46.0 %   MCV 90.0  78.0 - 100.0 fL   MCH 28.9  26.0 - 34.0 pg   MCHC 32.1  30.0 - 36.0 g/dL   RDW 13.1  11.5 - 15.5 %   Platelets 227  150 - 400 K/uL   Neutrophils Relative % 58  43 - 77 %   Neutro Abs 3.9  1.7 - 7.7 K/uL   Lymphocytes Relative 30  12 - 46 %   Lymphs Abs 2.0  0.7 - 4.0 K/uL   Monocytes Relative 8  3 - 12 %   Monocytes Absolute 0.5  0.1 - 1.0 K/uL  Eosinophils Relative 3  0 - 5 %   Eosinophils Absolute 0.2  0.0 - 0.7 K/uL   Basophils Relative 1  0 - 1 %   Basophils Absolute 0.0  0.0 - 0.1 K/uL  SEDIMENTATION RATE      Result Value Ref Range   Sed Rate 16  0 - 22 mm/hr  GLUCOSE, CAPILLARY      Result Value Ref Range   Glucose-Capillary 113 (*) 70 - 99 mg/dL  GLUCOSE, CAPILLARY      Result Value Ref Range   Glucose-Capillary 136 (*) 70 - 99 mg/dL  HEMOGLOBIN A1C      Result Value Ref Range   Hemoglobin A1C 6.4 (*) <5.7 %   Mean Plasma Glucose 137 (*) <117 mg/dL  LIPID PANEL      Result Value Ref Range   Cholesterol 175  0 - 200 mg/dL   Triglycerides 281 (*) <150 mg/dL   HDL 38 (*) >39 mg/dL   Total CHOL/HDL Ratio 4.6     VLDL 56 (*) 0 - 40 mg/dL   LDL Cholesterol 81  0 - 99 mg/dL  GLUCOSE, CAPILLARY      Result Value Ref Range   Glucose-Capillary 131 (*) 70 - 99 mg/dL   Comment 1 Documented in Chart     Comment 2 Notify RN    I-STAT TROPOININ, ED      Result Value Ref Range   Troponin i, poc 0.00  0.00 - 0.08 ng/mL   Comment 3             The neurologically relevant items on the patient's problem list were reviewed on today's visit.  Neurologic Examination  A problem focused neurological exam (12 or more points of the single system neurologic examination, vital signs counts as 1 point, cranial nerves count for 8 points) was performed.  Blood pressure 130/90, pulse 78, height 5' 3.5" (1.613 m), weight 189 lb (85.73 kg).  General - Well  nourished, well developed, in no apparent distress.  Ophthalmologic - Sharp disc margins OU.  Cardiovascular - Regular rate and rhythm with no murmur.  Mental Status -  Level of arousal and orientation to time, place, and person were intact. Language including expression, naming, repetition, comprehension, reading, and writing was assessed and found intact.  Cranial Nerves II - XII - II - Vision intact OU. III, IV, VI - Extraocular movements intact. V - Facial sensation intact bilaterally. VII - Facial movement intact bilaterally. VIII - Hearing & vestibular intact bilaterally. X - Palate elevates symmetrically. XI - Chin turning & shoulder shrug intact bilaterally. XII - Tongue protrusion intact.  Motor Strength - The patient's strength was normal in all extremities and pronator drift was absent.  Bulk was normal and fasciculations were absent.   Motor Tone - Muscle tone was assessed at the neck and appendages and was normal.  Reflexes - The patient's reflexes were normal in all extremities and she had no pathological reflexes.  Sensory - Light touch, temperature/pinprick, vibration and proprioception, and Romberg testing were assessed and were normal    Coordination - The patient had normal movements in the hands and feet with no ataxia or dysmetria.  Tremor was absent.  Gait and Station - The patient's transfers, posture, gait, station, and turns were observed as normal.  Data reviewed: I personally reviewed the images and agree with the radiology interpretations.  Ct Head Wo Contrast  01/13/2014 IMPRESSION: Atrophy and chronic ischemic white matter disease without acute intracranial  abnormality.   MRI/MRA head:  01/13/14 IMPRESSION:  MRI HEAD:  No acute infarct.  Mild to moderate small vessel disease type changes.  MRA HEAD:  No medium or large size vessel significant stenosis or occlusion.  Tiny bulge basilar tip may represent a tiny aneurysm (1-2 mm).   2D  Echocardiogram 01/13/14 - Left ventricle: The cavity size was normal. Wall thickness was normal. Systolic function was normal. The estimated ejection fraction was in the range of 55% to 60%. Wall motion was normal; there were no regional wall motion abnormalities. Doppler parameters are consistent with abnormal left ventricular relaxation (grade 1 diastolic dysfunction). - Atrial septum: No defect or patent foramen ovale was identified. - No cardiac source of emboli was indentified.  Carotid Doppler 01/13/14 Preliminary: <39% ICA stenosis bilat  Assessment: As you may recall, she is a 68 y.o. African American female followed up in clinic for suspected TIA. Patient had left vision loss 3 years ago, was told to have a posterior circulation stroke. on MRI there is one focal white matter changes around the right pontine but seems not able to explain her symptoms. The episode in June this year only involved lower half visual fields in the left eye only hard to be explained by amaurosis fugax. Her carotid Doppler was clean bilaterally. Intracranial vessels on MRA also not remarkable. She does have moderate white matter disease and multiple stroke risk factors but seems in good control. However, pt does complain of bitemporal achy headache and with visual changes, we have to rule out temporal arteritis. Complicated migraine also on the DDx. Will check ESR, CRP and continue stroke prevention.  Diagnoses from this visit: Transient cerebral ischemia, unspecified transient cerebral ischemia type  History of CVA (cerebrovascular accident)  Unspecified hypothyroidism  Plan:  - check ESR, CRP, ANA. Also check LFT and TSH since pt on high dose statin and hx of hypothyroidism and last TSH was one year ago. - continue ASA and lipitor for stroke prevention - continue current home meds for risk factor modification - continue follow up closely with PCP for stroke risk factor control - RTC in 3 months.  Orders  Placed This Encounter  Procedures  . Sedimentation Rate  . Hepatic Function Panel  . C-reactive Protein  . ANA  . TSH  . T4, Free     Patient Instructions  1. You need some blood test to rule out stroke mimic conditions 2. Continue ASA and lipitor for stroke prevention 3. Continue other meds for risk factor modification 4. Monitor BP and glucose at home 5. Close follow up with PCP for BP and glucose control 6. Follow up with me in clinic in about 3 months.

## 2014-02-11 NOTE — Patient Instructions (Signed)
1. You need some blood test to rule out stroke mimic conditions 2. Continue ASA and lipitor for stroke prevention 3. Continue other meds for risk factor modification 4. Monitor BP and glucose at home 5. Close follow up with PCP for BP and glucose control 6. Follow up with me in clinic in about 3 months.

## 2014-02-12 LAB — HEPATIC FUNCTION PANEL
ALK PHOS: 89 IU/L (ref 39–117)
ALT: 17 IU/L (ref 0–32)
AST: 25 IU/L (ref 0–40)
Albumin: 4.3 g/dL (ref 3.5–4.8)
BILIRUBIN DIRECT: 0.1 mg/dL (ref 0.00–0.40)
Total Bilirubin: 0.3 mg/dL (ref 0.0–1.2)
Total Protein: 6.7 g/dL (ref 6.0–8.5)

## 2014-02-12 LAB — T4, FREE: Free T4: 1.31 ng/dL (ref 0.82–1.77)

## 2014-02-12 LAB — ANA: ANA: NEGATIVE

## 2014-02-12 LAB — SEDIMENTATION RATE: Sed Rate: 12 mm/hr (ref 0–40)

## 2014-02-12 LAB — C-REACTIVE PROTEIN: CRP: 1.2 mg/L (ref 0.0–4.9)

## 2014-02-12 LAB — TSH: TSH: 0.975 u[IU]/mL (ref 0.450–4.500)

## 2014-07-10 ENCOUNTER — Encounter (HOSPITAL_COMMUNITY): Payer: Self-pay | Admitting: Cardiology

## 2015-04-09 ENCOUNTER — Ambulatory Visit (INDEPENDENT_AMBULATORY_CARE_PROVIDER_SITE_OTHER): Payer: Medicare Other | Admitting: Internal Medicine

## 2015-04-09 ENCOUNTER — Encounter: Payer: Self-pay | Admitting: Internal Medicine

## 2015-04-09 VITALS — BP 130/82 | HR 62 | Ht 63.5 in | Wt 190.4 lb

## 2015-04-09 DIAGNOSIS — R0602 Shortness of breath: Secondary | ICD-10-CM | POA: Diagnosis not present

## 2015-04-09 DIAGNOSIS — R5383 Other fatigue: Secondary | ICD-10-CM

## 2015-04-09 NOTE — Progress Notes (Signed)
Cardiology Office Note   Date:  04/15/2015   ID:  Katrina Swanson, DOB 1946/04/23, MRN [id number]  PCP:  Joseph Art, MD  Cardiologist:   Dorris Carnes, MD   No chief complaint on file.     History of Present Illness: Katrina Swanson is a 69 y.o. female with a history ofHTN, HL, sleep apneal  On CPAP, PE, Normal cath in 2012.  CVA in 2012.  Followed by Dr Marjory Lies.     Seen at [facility] had calcium build up around heart.  Went there because of GI bleeding   Patient denies CP  Does get tired Does have  good  days  Other days gives out.      Current Outpatient Prescriptions  Medication Sig Dispense Refill  . albuterol (PROVENTIL HFA;VENTOLIN HFA) 108 (90 BASE) MCG/ACT inhaler Inhale 2 puffs into the lungs every 4 (four) hours as needed for wheezing or shortness of breath.    Marland Kitchen aspirin 325 MG tablet Take 325 mg by mouth 3 (three) times a week.    Marland Kitchen atorvastatin (LIPITOR) 80 MG tablet Take 80 mg by mouth at bedtime.     Marland Kitchen levothyroxine (SYNTHROID, LEVOTHROID) 75 MCG tablet Take 75 mcg by mouth daily before breakfast.    . metFORMIN (GLUCOPHAGE) 500 MG tablet Take 500 mg by mouth 2 (two) times daily with a meal.    . nitroGLYCERIN (NITROSTAT) 0.4 MG SL tablet Place 0.4 mg under the tongue every 5 (five) minutes as needed for chest pain.    . Olmesartan-Amlodipine-HCTZ (TRIBENZOR) 20-5-12.5 MG TABS Take 1 tablet by mouth daily.    Marland Kitchen oxybutynin (DITROPAN-XL) 5 MG 24 hr tablet Take 5 mg by mouth daily.    . pantoprazole (PROTONIX) 40 MG tablet Take 40 mg by mouth 2 (two) times daily before a meal.      No current facility-administered medications for this visit.    Allergies:   Review of patient's allergies indicates no known allergies.   Past Medical History  Diagnosis Date  . Stroke     2012  . Diabetes mellitus without complication   . Hypertension   . High cholesterol   . Thyroid disease     hypothyroid    Past Surgical History  Procedure Laterality Date    . Appendectomy    . Carpal tunnel release    . Cataracts    . Cardiac catheterization    . Left heart catheterization with coronary angiogram N/A 04/29/2013    Procedure: LEFT HEART CATHETERIZATION WITH CORONARY ANGIOGRAM;  Surgeon: Peter M Martinique, MD;  Location: [facility] CATH LAB;  Service: Cardiovascular;  Laterality: N/A;     Social History:  The patient  reports that she has never smoked. She does not have any smokeless tobacco history on file. She reports that she does not drink alcohol or use illicit drugs.   Family History:  The patient's family history includes CAD in her father; Cancer in her maternal aunt; Diabetes Mellitus II in her mother.    ROS:  Please see the history of present illness. All other systems are reviewed and  Negative to the above problem except as noted.    PHYSICAL EXAM: VS:  BP 130/82 mmHg  Pulse 62  Ht 5' 3.5" (1.613 m)  Wt 190 lb 6.4 oz (86.365 kg)  BMI 33.19 kg/m2  GEN: Well nourished, well developed, in no acute distress HEENT: normal Neck: no JVD, carotid bruits, or masses Cardiac: RRR; no murmurs, rubs, or  gallops,no edema  Respiratory:  clear to auscultation bilaterally, normal work of breathing GI: soft, nontender, nondistended, + BS  No hepatomegaly  MS: no deformity Moving all extremities   Skin: warm and dry, no rash Neuro:  Strength and sensation are intact Psych: euthymic mood, full affect   EKG:  EKG is ordered today.  SR 62 bpm     Lipid Panel    Component Value Date/Time   CHOL 175 01/13/2014 1251   TRIG 281* 01/13/2014 1251   HDL 38* 01/13/2014 1251   CHOLHDL 4.6 01/13/2014 1251   VLDL 56* 01/13/2014 1251   LDLCALC 81 01/13/2014 1251      Wt Readings from Last 3 Encounters:  04/09/15 190 lb 6.4 oz (86.365 kg)  02/11/14 189 lb (85.73 kg)  01/13/14 186 lb 4.6 oz (84.5 kg)      ASSESSMENT AND PLAN:  1. Fatigue.  SOB  Pt had normal cath in past  CT scan with coronary calcifications. Will set up for lexiscan myoview  to eval for ischemia  2.  HL  ON statin  LDL is pretty good    3  HTN  Adq BP     Signed, Dorris Carnes, MD  04/15/2015 9:53 PM    [facility] [address], [place], [place]  [address] Phone: [phone]; Fax: [phone]

## 2015-04-09 NOTE — Patient Instructions (Signed)
Your physician recommends that you continue on your current medications as directed. Please refer to the Current Medication list given to you today.  Your physician has requested that you have a lexiscan myoview. For further information please visit [email or web address]. Please follow instruction sheet, as given.  Your physician wants you to follow-up in 9 months with Dr. Harrington Challenger (next summer). You will receive a reminder letter in the mail two months in advance. If you don't receive a letter, please call our office to schedule the follow-up appointment.

## 2015-04-10 ENCOUNTER — Telehealth: Payer: Self-pay | Admitting: Internal Medicine

## 2015-04-10 NOTE — Telephone Encounter (Signed)
Records received from via fax from [facility] placed in chart prep bin.

## 2015-04-14 ENCOUNTER — Telehealth (HOSPITAL_COMMUNITY): Payer: Self-pay

## 2015-04-14 NOTE — Telephone Encounter (Signed)
Patient given detailed instructions per Myocardial Perfusion Study Information Sheet for test on 04-16-2015 at 0745. Patient notified to arrive 15 minutes early and that it is imperative to arrive on time for appointment to keep from having the test rescheduled.  If you need to cancel or reschedule your appointment, please call the office within 24 hours of your appointment. Failure to do so may result in a cancellation of your appointment, and a $50 no show fee. Patient verbalized understanding. Oletta Lamas, Isabellarose Kope A

## 2015-04-16 ENCOUNTER — Ambulatory Visit (HOSPITAL_COMMUNITY): Payer: Medicare Other | Attending: Internal Medicine

## 2015-04-16 DIAGNOSIS — R5383 Other fatigue: Secondary | ICD-10-CM | POA: Diagnosis not present

## 2015-04-16 DIAGNOSIS — R0602 Shortness of breath: Secondary | ICD-10-CM | POA: Diagnosis not present

## 2015-04-16 DIAGNOSIS — I10 Essential (primary) hypertension: Secondary | ICD-10-CM | POA: Diagnosis not present

## 2015-04-16 LAB — MYOCARDIAL PERFUSION IMAGING
CHL CUP NUCLEAR SSS: 9
CSEPPHR: 105 {beats}/min
LHR: 0.17
LV dias vol: 76 mL
LV sys vol: 24 mL
Rest HR: 61 {beats}/min
SDS: 6
SRS: 3
TID: 1.07

## 2015-04-16 MED ORDER — REGADENOSON 0.4 MG/5ML IV SOLN
0.4000 mg | Freq: Once | INTRAVENOUS | Status: AC
  Administered 2015-04-16: 0.4 mg via INTRAVENOUS

## 2015-04-16 MED ORDER — TECHNETIUM TC 99M SESTAMIBI GENERIC - CARDIOLITE
31.4000 | Freq: Once | INTRAVENOUS | Status: AC | PRN
  Administered 2015-04-16: 31 via INTRAVENOUS

## 2015-04-16 MED ORDER — TECHNETIUM TC 99M SESTAMIBI GENERIC - CARDIOLITE
10.2000 | Freq: Once | INTRAVENOUS | Status: AC | PRN
  Administered 2015-04-16: 10 via INTRAVENOUS

## 2016-02-01 ENCOUNTER — Encounter: Payer: Self-pay | Admitting: Internal Medicine

## 2016-02-01 ENCOUNTER — Ambulatory Visit (INDEPENDENT_AMBULATORY_CARE_PROVIDER_SITE_OTHER): Payer: Medicare Other | Admitting: Internal Medicine

## 2016-02-01 VITALS — BP 122/84 | HR 72 | Ht 64.5 in | Wt 197.4 lb

## 2016-02-01 DIAGNOSIS — E785 Hyperlipidemia, unspecified: Secondary | ICD-10-CM | POA: Diagnosis not present

## 2016-02-01 DIAGNOSIS — I10 Essential (primary) hypertension: Secondary | ICD-10-CM

## 2016-02-01 LAB — CBC
HEMATOCRIT: 39 % (ref 35.0–45.0)
Hemoglobin: 12.5 g/dL (ref 11.7–15.5)
MCH: 27.8 pg (ref 27.0–33.0)
MCHC: 32.1 g/dL (ref 32.0–36.0)
MCV: 86.9 fL (ref 80.0–100.0)
MPV: 9.9 fL (ref 7.5–12.5)
Platelets: 284 10*3/uL (ref 140–400)
RBC: 4.49 MIL/uL (ref 3.80–5.10)
RDW: 14.1 % (ref 11.0–15.0)
WBC: 7.7 10*3/uL (ref 3.8–10.8)

## 2016-02-01 LAB — BASIC METABOLIC PANEL
BUN: 12 mg/dL (ref 7–25)
CHLORIDE: 103 mmol/L (ref 98–110)
CO2: 29 mmol/L (ref 20–31)
CREATININE: 0.96 mg/dL — AB (ref 0.60–0.93)
Calcium: 9.2 mg/dL (ref 8.6–10.4)
GLUCOSE: 111 mg/dL — AB (ref 65–99)
Potassium: 4.3 mmol/L (ref 3.5–5.3)
Sodium: 140 mmol/L (ref 135–146)

## 2016-02-01 LAB — LIPID PANEL
Cholesterol: 132 mg/dL (ref 125–200)
HDL: 39 mg/dL — ABNORMAL LOW (ref >=46)
LDL CALC: 63 mg/dL (ref <130)
Total CHOL/HDL Ratio: 3.4 Ratio (ref <=5.0)
Triglycerides: 149 mg/dL (ref <150)
VLDL: 30 mg/dL (ref <30)

## 2016-02-01 NOTE — Progress Notes (Signed)
Cardiology Office Note   Date:  02/01/2016   ID:  Katrina Katrina Swanson, DOB Oct 02, 1945, MRN [id number]  PCP:  Joseph Art, MD  Cardiologist:   Dorris Carnes, MD   F/U of HTN     History of Present Illness: Wednesday Katrina Swanson is a 70 y.o. female with a history of HTN and HL and OSA  Normal cath in 2012  Seen in [place] had Ca around heart   I saw her in Sept 2016  Myoview was normal    Since seen she denies CP  Breathing is stable  She does give out when she tries to do things  Not different than last fall.  Doesn't exercise or walk much  Claims back bugs her      Outpatient Prescriptions Prior to Visit  Medication Sig Dispense Refill  . albuterol (PROVENTIL HFA;VENTOLIN HFA) 108 (90 BASE) MCG/ACT inhaler Inhale 2 puffs into Katrina lungs every 4 (four) hours as needed for wheezing or shortness of breath.    Marland Kitchen aspirin 325 MG tablet Take 325 mg by mouth 3 (three) times a week.    Marland Kitchen atorvastatin (LIPITOR) 80 MG tablet Take 80 mg by mouth at bedtime.     Marland Kitchen levothyroxine (SYNTHROID, LEVOTHROID) 75 MCG tablet Take 75 mcg by mouth daily before breakfast.    . metFORMIN (GLUCOPHAGE) 500 MG tablet Take 500 mg by mouth 2 (two) times daily with a meal.    . nitroGLYCERIN (NITROSTAT) 0.4 MG SL tablet Place 0.4 mg under Katrina tongue every 5 (five) minutes as needed for chest pain.    . Olmesartan-Amlodipine-HCTZ (TRIBENZOR) 20-5-12.5 MG TABS Take 1 tablet by mouth daily.    Marland Kitchen oxybutynin (DITROPAN-XL) 5 MG 24 hr tablet Take 5 mg by mouth daily.    . pantoprazole (PROTONIX) 40 MG tablet Take 40 mg by mouth daily.      No facility-administered medications prior to visit.     Allergies:   Review of patient's allergies indicates no known allergies.   Past Medical History  Diagnosis Date  . Stroke ([place])     2012  . Diabetes mellitus without complication ([place])   . Hypertension   . High cholesterol   . Thyroid disease     hypothyroid    Past Surgical History  Procedure Laterality Date    . Appendectomy    . Carpal tunnel release    . Cataracts    . Cardiac catheterization    . Left heart catheterization with coronary angiogram N/A 04/29/2013    Procedure: LEFT HEART CATHETERIZATION WITH CORONARY ANGIOGRAM;  Surgeon: Peter M Martinique, MD;  Location: [facility] CATH LAB;  Service: Cardiovascular;  Laterality: N/A;     Social History:  Katrina patient  reports that she has never smoked. She does not have any smokeless tobacco history on file. She reports that she does not drink alcohol or use illicit drugs.   Family History:  Katrina patient's family history includes CAD in her father; Cancer in her maternal aunt; Diabetes Mellitus II in her mother.    ROS:  Please see Katrina history of present illness. All other systems are reviewed and  Negative to Katrina above problem except as noted.    PHYSICAL EXAM: VS:  BP 122/84 mmHg  Pulse 72  Ht 5' 4.5" (1.638 m)  Wt 197 lb 6.4 oz (89.54 kg)  BMI 33.37 kg/m2  SpO2 98%  GEN: Well nourished, well developed, in no acute distress HEENT: normal Neck: no JVD, carotid  bruits, or masses Cardiac: RRR; no murmurs, rubs, or gallops,no edema  Respiratory:  clear to auscultation bilaterally, normal work of breathing GI: soft, nontender, nondistended, + BS  No hepatomegaly  MS: no deformity Moving all extremities   Skin: warm and dry, no rash Neuro:  Strength and sensation are intact Psych: euthymic mood, full affect   EKG:  EKG is ordered today.  SR 72 bpm  Nonspecific ST changes     Lipid Panel    Component Value Date/Time   CHOL 175 01/13/2014 1251   TRIG 281* 01/13/2014 1251   HDL 38* 01/13/2014 1251   CHOLHDL 4.6 01/13/2014 1251   VLDL 56* 01/13/2014 1251   LDLCALC 81 01/13/2014 1251      Wt Readings from Last 3 Encounters:  02/01/16 197 lb 6.4 oz (89.54 kg)  04/16/15 190 lb (86.183 kg)  04/09/15 190 lb 6.4 oz (86.365 kg)      ASSESSMENT AND PLAN:  1  CAD  Evid of CAD on CT scan  Myoview though normal  I am not convinced of active  symtpoms BUT I do think she needs to get more active  Encouraged her to join gym, do exercises at home  2  HTN  Good Control  Check BMET   3  HL  Keep on meds  Check lipids  Did eat banana today    Signed, Dorris Carnes, MD  02/01/2016 7:58 AM    [address], [place], [place]  [address] Phone: [phone]; Fax: [phone]

## 2016-02-01 NOTE — Patient Instructions (Signed)
Your physician recommends that you continue on your current medications as directed. Please refer to the Current Medication list given to you today. Your physician recommends that you return for lab work in: today (lipids, bmet, cbc)   The number for Katrina Swanson is [phone].  You can call to discuss getting set up for an exercise program.  Your physician wants you to follow-up in: 8 months with Dr. Harrington Challenger.  You will receive a reminder letter in the mail two months in advance. If you don't receive a letter, please call our office to schedule the follow-up appointment.

## 2016-02-05 ENCOUNTER — Telehealth: Payer: Self-pay | Admitting: *Deleted

## 2016-02-05 NOTE — Telephone Encounter (Signed)
Pt notified of lab results by phone with verbal understanding.  

## 2016-02-22 ENCOUNTER — Other Ambulatory Visit: Payer: Self-pay | Admitting: *Deleted

## 2016-09-22 ENCOUNTER — Encounter: Payer: Self-pay | Admitting: Internal Medicine

## 2016-10-02 NOTE — Progress Notes (Signed)
Cardiology Office Note   Date:  10/03/2016   ID:  Katrina Swanson, DOB [birth year], MRN [id number]  PCP:  Joseph Art, MD  Cardiologist:   Dorris Carnes, MD   F/U of HTN      History of Present Illness: Katrina Swanson is a 71 y.o. female with a history of HTN, HL and OSA  Normal Cath in 2012  Normal myovue after  I saw her in July 2017 Dx dizziness  Hsop in [place] for dizziness  Had 2 aneurysms behind eye    Dizziness is off nd on   Breathing OK  Tired all the time  Goes to gym 2x per week    LDL was 63 in July 2017  COntinue on lipipro   Current Meds  Medication Sig  . albuterol (PROVENTIL HFA;VENTOLIN HFA) 108 (90 BASE) MCG/ACT inhaler Inhale 2 puffs into the lungs every 4 (four) hours as needed for wheezing or shortness of breath.  Marland Kitchen aspirin EC 81 MG tablet Take 81 mg by mouth daily.  Marland Kitchen atorvastatin (LIPITOR) 80 MG tablet Take 80 mg by mouth at bedtime.   Marland Kitchen lactulose (CHRONULAC) 10 GM/15ML solution Take 15 mLs by mouth daily.  Marland Kitchen levocetirizine (XYZAL) 5 MG tablet Take 5 mg by mouth daily as needed for allergies.  Marland Kitchen levothyroxine (SYNTHROID, LEVOTHROID) 75 MCG tablet Take 75 mcg by mouth daily before breakfast.  . linagliptin (TRADJENTA) 5 MG TABS tablet Take 5 mg by mouth daily.  . nitroGLYCERIN (NITROSTAT) 0.4 MG SL tablet Place 0.4 mg under the tongue every 5 (five) minutes as needed for chest pain.  . Olmesartan-Amlodipine-HCTZ (TRIBENZOR) 20-5-12.5 MG TABS Take 1 tablet by mouth daily.  . pantoprazole (PROTONIX) 40 MG tablet Take 40 mg by mouth daily.   . pioglitazone (ACTOS) 15 MG tablet Take 15 mg by mouth daily.  Marland Kitchen tolterodine (DETROL LA) 4 MG 24 hr capsule Take 4 mg by mouth daily.     Allergies:   Patient has no known allergies.   Past Medical History:  Diagnosis Date  . Diabetes mellitus without complication ([place])   . High cholesterol   . Hypertension   . Stroke ([place])    2012  . Thyroid disease    hypothyroid    Past Surgical History:  Procedure  Laterality Date  . APPENDECTOMY    . CARDIAC CATHETERIZATION    . CARPAL TUNNEL RELEASE    . cataracts    . LEFT HEART CATHETERIZATION WITH CORONARY ANGIOGRAM N/A 04/29/2013   Procedure: LEFT HEART CATHETERIZATION WITH CORONARY ANGIOGRAM;  Surgeon: Peter M Martinique, MD;  Location: [facility] CATH LAB;  Service: Cardiovascular;  Laterality: N/A;     Social History:  The patient  reports that she has never smoked. She has never used smokeless tobacco. She reports that she does not drink alcohol or use drugs.   Family History:  The patient's family history includes CAD in her father; Cancer in her maternal aunt; Diabetes Mellitus II in her mother.    ROS:  Please see the history of present illness. All other systems are reviewed and  Negative to the above problem except as noted.    PHYSICAL EXAM: VS:  BP 136/86 (BP Location: Right Arm)   Pulse 82   Ht 5' 4.5" (1.638 m)   Wt 202 lb (91.6 kg)   BMI 34.14 kg/m   GEN: Well nourished, well developed, in no acute distress  HEENT: normal  Neck: no JVD, carotid bruits, or masses Cardiac: RRR;  no murmurs, rubs, or gallops,no edema  Respiratory:  clear to auscultation bilaterally, normal work of breathing GI: soft, nontender, nondistended, + BS  No hepatomegaly  MS: no deformity Moving all extremities   Skin: warm and dry, no rash Neuro:  Strength and sensation are intact Psych: euthymic mood, full affect   EKG:  EKG is not ordered today.   Lipid Panel    Component Value Date/Time   CHOL 132 02/01/2016 0817   TRIG 149 02/01/2016 0817   HDL 39 (L) 02/01/2016 0817   CHOLHDL 3.4 02/01/2016 0817   VLDL 30 02/01/2016 0817   LDLCALC 63 02/01/2016 0817      Wt Readings from Last 3 Encounters:  10/03/16 202 lb (91.6 kg)  02/01/16 197 lb 6.4 oz (89.5 kg)  04/16/15 190 lb (86.2 kg)      ASSESSMENT AND PLAN:  1  HL   Excellent control  2  DM   Testing different meds now    3  Dizzienss Occasional  4  HTN  Fair  WOuld not push further  with dizzienss    Current medicines are reviewed at length with the patient today.  The patient does not have concerns regarding medicines.  Signed, Dorris Carnes, MD  10/03/2016 9:15 AM    Katrina Swanson, [place], [place]  [address] Phone: [phone]; Fax: [phone]

## 2016-10-03 ENCOUNTER — Ambulatory Visit (INDEPENDENT_AMBULATORY_CARE_PROVIDER_SITE_OTHER): Payer: Medicare Other | Admitting: Internal Medicine

## 2016-10-03 ENCOUNTER — Encounter (INDEPENDENT_AMBULATORY_CARE_PROVIDER_SITE_OTHER): Payer: Self-pay

## 2016-10-03 VITALS — BP 136/86 | HR 82 | Ht 64.5 in | Wt 202.0 lb

## 2016-10-03 DIAGNOSIS — G4733 Obstructive sleep apnea (adult) (pediatric): Secondary | ICD-10-CM | POA: Diagnosis not present

## 2016-10-03 DIAGNOSIS — R42 Dizziness and giddiness: Secondary | ICD-10-CM | POA: Diagnosis not present

## 2016-10-03 DIAGNOSIS — I10 Essential (primary) hypertension: Secondary | ICD-10-CM

## 2016-10-03 NOTE — Patient Instructions (Signed)
Your physician recommends that you continue on your current medications as directed. Please refer to the Current Medication list given to you today.  Your physician wants you to follow-up in: 8 months (OCT/NOV).  You will receive a reminder letter in the mail two months in advance. If you don't receive a letter, please call our office to schedule the follow-up appointment.

## 2016-12-13 ENCOUNTER — Other Ambulatory Visit: Payer: Self-pay | Admitting: Neurosurgery

## 2017-01-05 ENCOUNTER — Encounter (HOSPITAL_COMMUNITY): Payer: Self-pay

## 2017-01-05 ENCOUNTER — Encounter (HOSPITAL_COMMUNITY)
Admission: RE | Admit: 2017-01-05 | Discharge: 2017-01-05 | Disposition: A | Discharge status: Home / Self Care | Payer: Medicare Other | Source: Ambulatory Visit | Attending: Neurosurgery | Admitting: Neurosurgery

## 2017-01-05 DIAGNOSIS — Z01812 Encounter for preprocedural laboratory examination: Secondary | ICD-10-CM | POA: Diagnosis not present

## 2017-01-05 DIAGNOSIS — E119 Type 2 diabetes mellitus without complications: Secondary | ICD-10-CM | POA: Diagnosis not present

## 2017-01-05 DIAGNOSIS — M48062 Spinal stenosis, lumbar region with neurogenic claudication: Secondary | ICD-10-CM | POA: Insufficient documentation

## 2017-01-05 HISTORY — DX: Unspecified asthma, uncomplicated: J45.909

## 2017-01-05 LAB — CBC
HEMATOCRIT: 39.2 % (ref 36.0–46.0)
Hemoglobin: 12.4 g/dL (ref 12.0–15.0)
MCH: 27.9 pg (ref 26.0–34.0)
MCHC: 31.6 g/dL (ref 30.0–36.0)
MCV: 88.3 fL (ref 78.0–100.0)
Platelets: 251 10*3/uL (ref 150–400)
RBC: 4.44 MIL/uL (ref 3.87–5.11)
RDW: 13.3 % (ref 11.5–15.5)
WBC: 7.2 10*3/uL (ref 4.0–10.5)

## 2017-01-05 LAB — BASIC METABOLIC PANEL
Anion gap: 8 (ref 5–15)
BUN: 9 mg/dL (ref 6–20)
CALCIUM: 9.2 mg/dL (ref 8.9–10.3)
CO2: 26 mmol/L (ref 22–32)
Chloride: 105 mmol/L (ref 101–111)
Creatinine, Ser: 0.97 mg/dL (ref 0.44–1.00)
GFR calc Af Amer: >60 mL/min (ref >60)
GFR, EST NON AFRICAN AMERICAN: 57 mL/min — AB (ref >60)
GLUCOSE: 140 mg/dL — AB (ref 65–99)
Potassium: 3.8 mmol/L (ref 3.5–5.1)
Sodium: 139 mmol/L (ref 135–145)

## 2017-01-05 LAB — SURGICAL PCR SCREEN
MRSA, PCR: NEGATIVE
STAPHYLOCOCCUS AUREUS: NEGATIVE

## 2017-01-05 LAB — GLUCOSE, CAPILLARY: Glucose-Capillary: 121 mg/dL — ABNORMAL HIGH (ref 65–99)

## 2017-01-05 NOTE — Pre-Procedure Instructions (Addendum)
Lylee Martinezgarcia  01/05/2017      [id number] - [place], [place] - [id number] [place] [address] Phone: [phone] Fax: [phone]    Your procedure is scheduled on June 14.  Report to [facility] Admitting at [place] A.M.  Call this number if you have problems the morning of surgery:  [phone]   Remember:  Do not eat food or drink liquids after midnight.  Take these medicines the morning of surgery with A SIP OF WATER :levothyroxine, pantoprazole (PROTONIX), albuterol  (PROVENTIL HFA;VENTOLIN HFA)- bring with you   STOP aspirin, herbal medications, vitamins, NSAIDS- advil, aleve, ibuprofen as of today     How to Manage Your Diabetes Before and After Surgery  Why is it important to control my blood sugar before and after surgery? . Improving blood sugar levels before and after surgery helps healing and can limit problems. . A way of improving blood sugar control is eating a healthy diet by: o  Eating less sugar and carbohydrates o  Increasing activity/exercise o  Talking with your doctor about reaching your blood sugar goals . High blood sugars (greater than 180 mg/dL) can raise your risk of infections and slow your recovery, so you will need to focus on controlling your diabetes during the weeks before surgery. . Make sure that the doctor who takes care of your diabetes knows about your planned surgery including the date and location.  How do I manage my blood sugar before surgery? . Check your blood sugar at least 4 times a day, starting 2 days before surgery, to make sure that the level is not too high or low. o Check your blood sugar the morning of your surgery when you wake up and every 2 hours until you get to the Short Stay unit. . If your blood sugar is less than 70 mg/dL, you will need to treat for low blood sugar: o Do not take insulin. o Treat a low blood sugar (less than 70 mg/dL) with  cup of clear juice (cranberry  or apple), 4 glucose tablets, OR glucose gel. o Recheck blood sugar in 15 minutes after treatment (to make sure it is greater than 70 mg/dL). If your blood sugar is not greater than 70 mg/dL on recheck, call [phone] for further instructions. . Report your blood sugar to the short stay nurse when you get to Short Stay.  . If you are admitted to the hospital after surgery: o Your blood sugar will be checked by the staff and you will probably be given insulin after surgery (instead of oral diabetes medicines) to make sure you have good blood sugar levels. o The goal for blood sugar control after surgery is 80-180 mg/dL.     WHAT DO I DO ABOUT MY DIABETES MEDICATION?   Marland Kitchen Do not take oral diabetes medicines (pills) the morning of surgery. . The day of surgery, do not take other diabetes injectables, including Byetta (exenatide), Bydureon (exenatide ER), Victoza (liraglutide), or Trulicity (dulaglutide).  . If your CBG is greater than 220 mg/dL, you may take  of your sliding scale (correction) dose of insulin.  Other Instructions:          Patient Signature:  Date:   Nurse Signature:  Date:   Reviewed and Endorsed by [facility] Patient Education Committee, August 2015   Do not wear jewelry, make-up or nail polish.  Do not wear lotions, powders, or perfumes, or deoderant.  Do  not shave 48 hours prior to surgery.  Men may shave face and neck.  Do not bring valuables to the hospital.  [facility] is not responsible for any belongings or valuables.  Contacts, dentures or bridgework may not be worn into surgery.  Leave your suitcase in the car.  After surgery it may be brought to your room.  For patients admitted to the hospital, discharge time will be determined by your treatment team.  Patients discharged the day of surgery will not be allowed to drive home.   Name and phone number of your driver:    Special instructions:  Preparing for surgery  Please read over the  following fact sheets that you were given. Pain Booklet and Surgical Site Infection Prevention

## 2017-01-06 LAB — HEMOGLOBIN A1C
HEMOGLOBIN A1C: 6.6 % — AB (ref 4.8–5.6)
Mean Plasma Glucose: 143 mg/dL

## 2017-01-06 NOTE — Progress Notes (Signed)
Anesthesia Chart Review:  Pt is a 71 year old female scheduled for L4-5 laminectomy and foraminotomy on 01/12/2017 with Ashok Pall, M.D.  - PCP is Joaquim Lai, MD - Cardiologist is Dorris Carnes, M.D.  [facility] includes: HTN, DM, hyperlipidemia, stroke (2012), TIA (12/2013), asthma, hypothyroidism. Never smoker. BMI 30.  Medications include: Albuterol, ASA 81 mg, Lipitor, levothyroxine, linagliptin, olmesartan-amlodipine-HCTZ, Protonix  Preoperative labs 01/05/17 reviewed. HbA1c 6.6, glucose 140.  EKG 02/01/16: NSR nonspecific T wave abnormality.  Nuclear stress test 04/16/15: - There is no significant reversible ischemia (there is actually reverse redistribution - rest images are worse than stress images). LVEF 69% with normal wall motion. PVC's and a period of ventricular bigeminy were noted during the study. This is a low risk study.  Carotid duplex 01/13/14: Bilateral: 1-39% ICA stenosis. Vertebral artery flow is antegrade.  Echo 01/13/14:  - Left ventricle: The cavity size was normal. Wall thickness was normal. Systolic function was normal. The estimated ejectionfraction was in the range of 55% to 60%. Wall motion was normal;there were no regional wall motion abnormalities. Dopplerparameters are consistent with abnormal left ventricularrelaxation (grade 1 diastolic dysfunction). - Atrial septum: No defect or patent foramen ovale was identified. - Impressions: No cardiac source of emboli was indentified.  Cardiac cath 04/29/13:  1. No significant CAD (<10% RCA) 2. Normal LV function  If no changes, I anticipate pt can proceed with surgery as scheduled.   Willeen Cass, FNP-BC [facility] Stay [facility]/Anesthesiology Phone: [phone] 01/06/2017 12:44 PM

## 2017-01-12 ENCOUNTER — Ambulatory Visit (HOSPITAL_COMMUNITY): Payer: Medicare Other | Admitting: Emergency Medicine

## 2017-01-12 ENCOUNTER — Encounter (HOSPITAL_COMMUNITY): Payer: Self-pay | Admitting: Urology

## 2017-01-12 ENCOUNTER — Ambulatory Visit (HOSPITAL_COMMUNITY): Payer: Medicare Other | Admitting: Anesthesiology

## 2017-01-12 ENCOUNTER — Ambulatory Visit (HOSPITAL_COMMUNITY): Payer: Medicare Other

## 2017-01-12 ENCOUNTER — Ambulatory Visit (HOSPITAL_COMMUNITY)
Admission: RE | Admit: 2017-01-12 | Discharge: 2017-01-13 | Disposition: A | Discharge status: Home / Self Care | Payer: Medicare Other | Source: Ambulatory Visit | Attending: Neurosurgery | Admitting: Neurosurgery

## 2017-01-12 ENCOUNTER — Encounter (HOSPITAL_COMMUNITY)
Admission: RE | Disposition: A | Discharge status: Home / Self Care | Payer: Self-pay | Source: Ambulatory Visit | Attending: Neurosurgery

## 2017-01-12 DIAGNOSIS — I10 Essential (primary) hypertension: Secondary | ICD-10-CM | POA: Diagnosis not present

## 2017-01-12 DIAGNOSIS — J45909 Unspecified asthma, uncomplicated: Secondary | ICD-10-CM | POA: Insufficient documentation

## 2017-01-12 DIAGNOSIS — Z79899 Other long term (current) drug therapy: Secondary | ICD-10-CM | POA: Diagnosis not present

## 2017-01-12 DIAGNOSIS — F17200 Nicotine dependence, unspecified, uncomplicated: Secondary | ICD-10-CM | POA: Diagnosis not present

## 2017-01-12 DIAGNOSIS — M48062 Spinal stenosis, lumbar region with neurogenic claudication: Secondary | ICD-10-CM | POA: Diagnosis present

## 2017-01-12 DIAGNOSIS — Z419 Encounter for procedure for purposes other than remedying health state, unspecified: Secondary | ICD-10-CM

## 2017-01-12 DIAGNOSIS — E119 Type 2 diabetes mellitus without complications: Secondary | ICD-10-CM | POA: Insufficient documentation

## 2017-01-12 DIAGNOSIS — Z7982 Long term (current) use of aspirin: Secondary | ICD-10-CM | POA: Diagnosis not present

## 2017-01-12 DIAGNOSIS — Z8673 Personal history of transient ischemic attack (TIA), and cerebral infarction without residual deficits: Secondary | ICD-10-CM | POA: Diagnosis not present

## 2017-01-12 HISTORY — PX: LUMBAR LAMINECTOMY/DECOMPRESSION MICRODISCECTOMY: SHX5026

## 2017-01-12 LAB — GLUCOSE, CAPILLARY
Glucose-Capillary: 104 mg/dL — ABNORMAL HIGH (ref 65–99)
Glucose-Capillary: 89 mg/dL (ref 65–99)
Glucose-Capillary: 161 mg/dL — ABNORMAL HIGH (ref 65–99)
Glucose-Capillary: 201 mg/dL — ABNORMAL HIGH (ref 65–99)

## 2017-01-12 SURGERY — LUMBAR LAMINECTOMY/DECOMPRESSION MICRODISCECTOMY 1 LEVEL
Anesthesia: General

## 2017-01-12 MED ORDER — CELECOXIB 200 MG PO CAPS
200.0000 mg | ORAL_CAPSULE | Freq: Two times a day (BID) | ORAL | Status: DC
  Administered 2017-01-12 – 2017-01-13 (×2): 200 mg via ORAL
  Filled 2017-01-12 (×2): qty 1

## 2017-01-12 MED ORDER — ASPIRIN EC 81 MG PO TBEC
81.0000 mg | DELAYED_RELEASE_TABLET | Freq: Every day | ORAL | Status: DC
  Administered 2017-01-13: 81 mg via ORAL
  Filled 2017-01-12: qty 1

## 2017-01-12 MED ORDER — POTASSIUM CHLORIDE IN NACL 20-0.9 MEQ/L-% IV SOLN: INTRAVENOUS | Status: DC

## 2017-01-12 MED ORDER — SODIUM CHLORIDE 0.9 % IR SOLN
Status: DC | PRN
  Administered 2017-01-12: 1000 mL

## 2017-01-12 MED ORDER — LIDOCAINE-EPINEPHRINE 1 %-1:100000 IJ SOLN
INTRAMUSCULAR | Status: DC | PRN
  Administered 2017-01-12: 20 mL

## 2017-01-12 MED ORDER — ACETAMINOPHEN 650 MG RE SUPP: 650.0000 mg | RECTAL | Status: DC | PRN

## 2017-01-12 MED ORDER — DEXAMETHASONE SODIUM PHOSPHATE 10 MG/ML IJ SOLN
INTRAMUSCULAR | Status: DC | PRN
  Administered 2017-01-12: 10 mg via INTRAVENOUS

## 2017-01-12 MED ORDER — NITROGLYCERIN 0.4 MG SL SUBL
0.4000 mg | SUBLINGUAL_TABLET | SUBLINGUAL | Status: DC | PRN
Start: 2017-01-12 — End: 2017-01-13

## 2017-01-12 MED ORDER — SODIUM CHLORIDE 0.9% FLUSH: 3.0000 mL | Freq: Two times a day (BID) | INTRAVENOUS | Status: DC

## 2017-01-12 MED ORDER — DOCUSATE SODIUM 100 MG PO CAPS
100.0000 mg | ORAL_CAPSULE | Freq: Two times a day (BID) | ORAL | Status: DC
  Administered 2017-01-12: 100 mg via ORAL
  Filled 2017-01-12 (×2): qty 1

## 2017-01-12 MED ORDER — ACETAMINOPHEN 325 MG PO TABS: 650.0000 mg | ORAL_TABLET | ORAL | Status: DC | PRN

## 2017-01-12 MED ORDER — THROMBIN 5000 UNITS EX SOLR
CUTANEOUS | Status: DC | PRN
  Administered 2017-01-12 (×2): 5000 [IU] via TOPICAL

## 2017-01-12 MED ORDER — PHENYLEPHRINE HCL 10 MG/ML IJ SOLN
INTRAVENOUS | Status: DC | PRN
  Administered 2017-01-12: 25 ug/min via INTRAVENOUS

## 2017-01-12 MED ORDER — ONDANSETRON HCL 4 MG/2ML IJ SOLN
INTRAMUSCULAR | Status: DC | PRN
  Administered 2017-01-12: 4 mg via INTRAVENOUS

## 2017-01-12 MED ORDER — LACTULOSE 10 GM/15ML PO SOLN
10.0000 g | Freq: Every day | ORAL | Status: DC
  Filled 2017-01-12: qty 15

## 2017-01-12 MED ORDER — HEMOSTATIC AGENTS (NO CHARGE) OPTIME
TOPICAL | Status: DC | PRN
  Administered 2017-01-12: 1 via TOPICAL

## 2017-01-12 MED ORDER — BUPIVACAINE HCL (PF) 0.5 % IJ SOLN
INTRAMUSCULAR | Status: DC | PRN
  Administered 2017-01-12: 30 mL

## 2017-01-12 MED ORDER — LINAGLIPTIN 5 MG PO TABS
5.0000 mg | ORAL_TABLET | Freq: Every day | ORAL | Status: DC
  Administered 2017-01-13: 5 mg via ORAL
  Filled 2017-01-12: qty 1

## 2017-01-12 MED ORDER — SCOPOLAMINE 1 MG/3DAYS TD PT72
MEDICATED_PATCH | TRANSDERMAL | Status: AC
  Filled 2017-01-12: qty 1

## 2017-01-12 MED ORDER — MORPHINE SULFATE (PF) 4 MG/ML IV SOLN
2.0000 mg | INTRAVENOUS | Status: DC | PRN
Start: 2017-01-12 — End: 2017-01-13
  Administered 2017-01-12: 2 mg via INTRAVENOUS
  Filled 2017-01-12: qty 1

## 2017-01-12 MED ORDER — AMLODIPINE BESYLATE 5 MG PO TABS
5.0000 mg | ORAL_TABLET | Freq: Every day | ORAL | Status: DC
  Administered 2017-01-13: 5 mg via ORAL
  Filled 2017-01-12: qty 1

## 2017-01-12 MED ORDER — OLMESARTAN-AMLODIPINE-HCTZ 20-5-12.5 MG PO TABS: 1.0000 | ORAL_TABLET | Freq: Every day | ORAL | Status: DC

## 2017-01-12 MED ORDER — CEFAZOLIN SODIUM-DEXTROSE 2-4 GM/100ML-% IV SOLN
INTRAVENOUS | Status: AC
  Filled 2017-01-12: qty 100

## 2017-01-12 MED ORDER — HYDROCHLOROTHIAZIDE 12.5 MG PO CAPS
12.5000 mg | ORAL_CAPSULE | Freq: Every day | ORAL | Status: DC
  Administered 2017-01-13: 12.5 mg via ORAL
  Filled 2017-01-12: qty 1

## 2017-01-12 MED ORDER — BUPIVACAINE HCL (PF) 0.5 % IJ SOLN
INTRAMUSCULAR | Status: AC
  Filled 2017-01-12: qty 30

## 2017-01-12 MED ORDER — PROPOFOL 10 MG/ML IV BOLUS
INTRAVENOUS | Status: AC
  Filled 2017-01-12: qty 20

## 2017-01-12 MED ORDER — FENTANYL CITRATE (PF) 100 MCG/2ML IJ SOLN
25.0000 ug | INTRAMUSCULAR | Status: DC | PRN
  Administered 2017-01-12: 25 ug via INTRAVENOUS

## 2017-01-12 MED ORDER — ZOLPIDEM TARTRATE 5 MG PO TABS: 5.0000 mg | ORAL_TABLET | Freq: Every evening | ORAL | Status: DC | PRN

## 2017-01-12 MED ORDER — FENTANYL CITRATE (PF) 250 MCG/5ML IJ SOLN
INTRAMUSCULAR | Status: AC
  Filled 2017-01-12: qty 5

## 2017-01-12 MED ORDER — ACETAMINOPHEN 10 MG/ML IV SOLN
INTRAVENOUS | Status: DC | PRN
  Administered 2017-01-12: 1000 mg via INTRAVENOUS

## 2017-01-12 MED ORDER — ROCURONIUM BROMIDE 10 MG/ML (PF) SYRINGE
PREFILLED_SYRINGE | INTRAVENOUS | Status: AC
  Filled 2017-01-12: qty 5

## 2017-01-12 MED ORDER — SENNOSIDES-DOCUSATE SODIUM 8.6-50 MG PO TABS: 1.0000 | ORAL_TABLET | Freq: Every evening | ORAL | Status: DC | PRN

## 2017-01-12 MED ORDER — BISACODYL 5 MG PO TBEC: 5.0000 mg | DELAYED_RELEASE_TABLET | Freq: Every day | ORAL | Status: DC | PRN

## 2017-01-12 MED ORDER — CHLORHEXIDINE GLUCONATE CLOTH 2 % EX PADS: 6.0000 | MEDICATED_PAD | Freq: Once | CUTANEOUS | Status: DC

## 2017-01-12 MED ORDER — ONDANSETRON HCL 4 MG/2ML IJ SOLN
4.0000 mg | Freq: Four times a day (QID) | INTRAMUSCULAR | Status: DC | PRN
  Administered 2017-01-12: 4 mg via INTRAVENOUS
  Filled 2017-01-12: qty 2

## 2017-01-12 MED ORDER — FENTANYL CITRATE (PF) 100 MCG/2ML IJ SOLN
INTRAMUSCULAR | Status: AC
  Administered 2017-01-12: 25 ug via INTRAVENOUS
  Filled 2017-01-12: qty 2

## 2017-01-12 MED ORDER — LIDOCAINE 2% (20 MG/ML) 5 ML SYRINGE
INTRAMUSCULAR | Status: AC
  Filled 2017-01-12: qty 5

## 2017-01-12 MED ORDER — ROCURONIUM BROMIDE 100 MG/10ML IV SOLN
INTRAVENOUS | Status: DC | PRN
  Administered 2017-01-12: 50 mg via INTRAVENOUS

## 2017-01-12 MED ORDER — MAGNESIUM CITRATE PO SOLN: 1.0000 | Freq: Once | ORAL | Status: DC | PRN

## 2017-01-12 MED ORDER — SODIUM CHLORIDE 0.9% FLUSH: 3.0000 mL | INTRAVENOUS | Status: DC | PRN

## 2017-01-12 MED ORDER — SUGAMMADEX SODIUM 200 MG/2ML IV SOLN
INTRAVENOUS | Status: AC
  Filled 2017-01-12: qty 4

## 2017-01-12 MED ORDER — PROPOFOL 10 MG/ML IV BOLUS
INTRAVENOUS | Status: DC | PRN
  Administered 2017-01-12: 200 mg via INTRAVENOUS

## 2017-01-12 MED ORDER — CETIRIZINE HCL 10 MG PO TABS: 5.0000 mg | ORAL_TABLET | Freq: Every day | ORAL | Status: DC | PRN

## 2017-01-12 MED ORDER — LIDOCAINE HCL (CARDIAC) 20 MG/ML IV SOLN
INTRAVENOUS | Status: DC | PRN
  Administered 2017-01-12: 80 mg via INTRAVENOUS

## 2017-01-12 MED ORDER — LIDOCAINE-EPINEPHRINE 1 %-1:100000 IJ SOLN
INTRAMUSCULAR | Status: AC
  Filled 2017-01-12: qty 1

## 2017-01-12 MED ORDER — INSULIN ASPART 100 UNIT/ML ~~LOC~~ SOLN
0.0000 [IU] | Freq: Three times a day (TID) | SUBCUTANEOUS | Status: DC
  Administered 2017-01-13: 3 [IU] via SUBCUTANEOUS

## 2017-01-12 MED ORDER — FENTANYL CITRATE (PF) 100 MCG/2ML IJ SOLN
INTRAMUSCULAR | Status: DC | PRN
  Administered 2017-01-12: 75 ug via INTRAVENOUS
  Administered 2017-01-12: 50 ug via INTRAVENOUS
  Administered 2017-01-12: 100 ug via INTRAVENOUS
  Administered 2017-01-12: 75 ug via INTRAVENOUS

## 2017-01-12 MED ORDER — EPHEDRINE 5 MG/ML INJ
INTRAVENOUS | Status: AC
  Filled 2017-01-12: qty 10

## 2017-01-12 MED ORDER — CEFAZOLIN SODIUM-DEXTROSE 2-4 GM/100ML-% IV SOLN
2.0000 g | INTRAVENOUS | Status: AC
  Administered 2017-01-12: 2 g via INTRAVENOUS

## 2017-01-12 MED ORDER — ONDANSETRON HCL 4 MG/2ML IJ SOLN
INTRAMUSCULAR | Status: AC
  Filled 2017-01-12: qty 2

## 2017-01-12 MED ORDER — KETOROLAC TROMETHAMINE 15 MG/ML IJ SOLN
7.5000 mg | Freq: Four times a day (QID) | INTRAMUSCULAR | Status: DC
  Administered 2017-01-12 – 2017-01-13 (×2): 7.5 mg via INTRAVENOUS
  Filled 2017-01-12 (×2): qty 1

## 2017-01-12 MED ORDER — ACETAMINOPHEN 10 MG/ML IV SOLN
INTRAVENOUS | Status: AC
  Filled 2017-01-12: qty 100

## 2017-01-12 MED ORDER — LACTATED RINGERS IV SOLN
INTRAVENOUS | Status: DC
  Administered 2017-01-12 (×3): via INTRAVENOUS

## 2017-01-12 MED ORDER — IRBESARTAN 150 MG PO TABS
150.0000 mg | ORAL_TABLET | Freq: Every day | ORAL | Status: DC
  Administered 2017-01-13: 150 mg via ORAL
  Filled 2017-01-12: qty 1

## 2017-01-12 MED ORDER — DEXAMETHASONE SODIUM PHOSPHATE 10 MG/ML IJ SOLN
INTRAMUSCULAR | Status: AC
  Filled 2017-01-12: qty 2

## 2017-01-12 MED ORDER — ALBUTEROL SULFATE (2.5 MG/3ML) 0.083% IN NEBU: 3.0000 mL | INHALATION_SOLUTION | RESPIRATORY_TRACT | Status: DC | PRN

## 2017-01-12 MED ORDER — MENTHOL 3 MG MT LOZG: 1.0000 | LOZENGE | OROMUCOSAL | Status: DC | PRN

## 2017-01-12 MED ORDER — MORPHINE SULFATE (PF) 2 MG/ML IV SOLN
2.0000 mg | INTRAVENOUS | Status: DC | PRN
Start: 2017-01-12 — End: 2017-01-12

## 2017-01-12 MED ORDER — HYDROCODONE-ACETAMINOPHEN 5-325 MG PO TABS
1.0000 | ORAL_TABLET | ORAL | Status: DC | PRN
  Administered 2017-01-12 – 2017-01-13 (×3): 2 via ORAL
  Filled 2017-01-12 (×3): qty 2

## 2017-01-12 MED ORDER — THROMBIN 5000 UNITS EX SOLR
CUTANEOUS | Status: AC
  Filled 2017-01-12: qty 5000

## 2017-01-12 MED ORDER — ATORVASTATIN CALCIUM 20 MG PO TABS
80.0000 mg | ORAL_TABLET | Freq: Every day | ORAL | Status: DC
  Administered 2017-01-12: 80 mg via ORAL
  Filled 2017-01-12: qty 4

## 2017-01-12 MED ORDER — PHENOL 1.4 % MT LIQD: 1.0000 | OROMUCOSAL | Status: DC | PRN

## 2017-01-12 MED ORDER — PANTOPRAZOLE SODIUM 40 MG PO TBEC
40.0000 mg | DELAYED_RELEASE_TABLET | Freq: Every day | ORAL | Status: DC
  Administered 2017-01-13: 40 mg via ORAL
  Filled 2017-01-12: qty 1

## 2017-01-12 MED ORDER — DIAZEPAM 5 MG PO TABS
5.0000 mg | ORAL_TABLET | Freq: Four times a day (QID) | ORAL | Status: DC | PRN
  Administered 2017-01-12 – 2017-01-13 (×2): 5 mg via ORAL
  Filled 2017-01-12 (×2): qty 1

## 2017-01-12 MED ORDER — WHITE PETROLATUM GEL
Status: AC
  Administered 2017-01-12: 20:00:00
  Filled 2017-01-12: qty 1

## 2017-01-12 MED ORDER — ONDANSETRON HCL 4 MG PO TABS: 4.0000 mg | ORAL_TABLET | Freq: Four times a day (QID) | ORAL | Status: DC | PRN

## 2017-01-12 MED ORDER — LEVOTHYROXINE SODIUM 75 MCG PO TABS
75.0000 ug | ORAL_TABLET | Freq: Every day | ORAL | Status: DC
  Administered 2017-01-13: 75 ug via ORAL
  Filled 2017-01-12: qty 1

## 2017-01-12 MED ORDER — SUGAMMADEX SODIUM 200 MG/2ML IV SOLN
INTRAVENOUS | Status: AC
  Filled 2017-01-12: qty 2

## 2017-01-12 MED ORDER — SODIUM CHLORIDE 0.9 % IV SOLN: 250.0000 mL | INTRAVENOUS | Status: DC

## 2017-01-12 SURGICAL SUPPLY — 49 items
BAG DECANTER FOR FLEXI CONT (MISCELLANEOUS) ×2 IMPLANT
BENZOIN TINCTURE PRP APPL 2/3 (GAUZE/BANDAGES/DRESSINGS) IMPLANT
BLADE CLIPPER SURG (BLADE) IMPLANT
BUR MATCHSTICK NEURO 3.0 LAGG (BURR) ×2 IMPLANT
CANISTER SUCT 3000ML PPV (MISCELLANEOUS) ×2 IMPLANT
CARTRIDGE OIL MAESTRO DRILL (MISCELLANEOUS) ×1 IMPLANT
DECANTER SPIKE VIAL GLASS SM (MISCELLANEOUS) ×2 IMPLANT
DERMABOND ADVANCED (GAUZE/BANDAGES/DRESSINGS) ×1
DERMABOND ADVANCED .7 DNX12 (GAUZE/BANDAGES/DRESSINGS) ×1 IMPLANT
DIFFUSER DRILL AIR PNEUMATIC (MISCELLANEOUS) ×2 IMPLANT
DRAPE LAPAROTOMY 100X72X124 (DRAPES) ×2 IMPLANT
DRAPE MICROSCOPE LEICA (MISCELLANEOUS) ×2 IMPLANT
DRAPE POUCH INSTRU U-SHP 10X18 (DRAPES) ×2 IMPLANT
DRAPE SURG 17X23 STRL (DRAPES) ×2 IMPLANT
DURAPREP 26ML APPLICATOR (WOUND CARE) ×2 IMPLANT
ELECT BLADE 4.0 EZ CLEAN MEGAD (MISCELLANEOUS) ×2
ELECT REM PT RETURN 9FT ADLT (ELECTROSURGICAL) ×2
ELECTRODE BLDE 4.0 EZ CLN MEGD (MISCELLANEOUS) ×1 IMPLANT
ELECTRODE REM PT RTRN 9FT ADLT (ELECTROSURGICAL) ×1 IMPLANT
GAUZE SPONGE 4X4 12PLY STRL (GAUZE/BANDAGES/DRESSINGS) IMPLANT
GAUZE SPONGE 4X4 16PLY XRAY LF (GAUZE/BANDAGES/DRESSINGS) IMPLANT
GLOVE ECLIPSE 6.5 STRL STRAW (GLOVE) ×2 IMPLANT
GLOVE EXAM NITRILE LRG STRL (GLOVE) IMPLANT
GLOVE EXAM NITRILE XL STR (GLOVE) IMPLANT
GLOVE EXAM NITRILE XS STR PU (GLOVE) IMPLANT
GOWN STRL REUS W/ TWL LRG LVL3 (GOWN DISPOSABLE) ×2 IMPLANT
GOWN STRL REUS W/ TWL XL LVL3 (GOWN DISPOSABLE) IMPLANT
GOWN STRL REUS W/TWL 2XL LVL3 (GOWN DISPOSABLE) IMPLANT
GOWN STRL REUS W/TWL LRG LVL3 (GOWN DISPOSABLE) ×2
GOWN STRL REUS W/TWL XL LVL3 (GOWN DISPOSABLE)
KIT BASIN OR (CUSTOM PROCEDURE TRAY) ×2 IMPLANT
KIT ROOM TURNOVER OR (KITS) ×2 IMPLANT
NEEDLE HYPO 25X1 1.5 SAFETY (NEEDLE) ×2 IMPLANT
NEEDLE SPNL 18GX3.5 QUINCKE PK (NEEDLE) ×2 IMPLANT
NS IRRIG 1000ML POUR BTL (IV SOLUTION) ×2 IMPLANT
OIL CARTRIDGE MAESTRO DRILL (MISCELLANEOUS) ×2
PACK LAMINECTOMY NEURO (CUSTOM PROCEDURE TRAY) ×2 IMPLANT
PAD ARMBOARD 7.5X6 YLW CONV (MISCELLANEOUS) ×6 IMPLANT
RUBBERBAND STERILE (MISCELLANEOUS) ×4 IMPLANT
SPONGE LAP 4X18 X RAY DECT (DISPOSABLE) IMPLANT
SPONGE SURGIFOAM ABS GEL SZ50 (HEMOSTASIS) ×2 IMPLANT
STRIP CLOSURE SKIN 1/2X4 (GAUZE/BANDAGES/DRESSINGS) IMPLANT
SUT VIC AB 0 CT1 18XCR BRD8 (SUTURE) ×2 IMPLANT
SUT VIC AB 0 CT1 8-18 (SUTURE) ×2
SUT VIC AB 2-0 CT1 18 (SUTURE) ×2 IMPLANT
SUT VIC AB 3-0 SH 8-18 (SUTURE) ×2 IMPLANT
TOWEL GREEN STERILE (TOWEL DISPOSABLE) ×2 IMPLANT
TOWEL GREEN STERILE FF (TOWEL DISPOSABLE) ×2 IMPLANT
WATER STERILE IRR 1000ML POUR (IV SOLUTION) ×2 IMPLANT

## 2017-01-12 NOTE — Anesthesia Postprocedure Evaluation (Signed)
Anesthesia Post Note  Patient: Katrina Swanson  Procedure(s) Performed: Procedure(s) (LRB): LAMINECTOMY AND FORAMINOTOMY LUMBAR 4- LUMBAR 5 (N/A)     Patient location during evaluation: PACU Anesthesia Type: General Level of consciousness: awake Pain management: pain level controlled Vital Signs Assessment: post-procedure vital signs reviewed and stable Respiratory status: spontaneous breathing Cardiovascular status: stable Anesthetic complications: no    Last Vitals:  Vitals:   01/12/17 1730 01/12/17 1745  BP: (!) 141/73 137/79  Pulse: 82 77  Resp: 11 12  Temp:      Last Pain:  Vitals:   01/12/17 1730  TempSrc:   PainSc: Asleep                 Maciah Schweigert

## 2017-01-12 NOTE — Anesthesia Postprocedure Evaluation (Signed)
Anesthesia Post Note  Patient: Katrina Swanson  Procedure(s) Performed: Procedure(s) (LRB): LAMINECTOMY AND FORAMINOTOMY LUMBAR 4- LUMBAR 5 (N/A)     Patient location during evaluation: PACU Anesthesia Type: General Level of consciousness: awake Pain management: pain level controlled Vital Signs Assessment: post-procedure vital signs reviewed and stable Respiratory status: spontaneous breathing Cardiovascular status: stable Anesthetic complications: no    Last Vitals:  Vitals:   01/12/17 1730 01/12/17 1745  BP: (!) 141/73 137/79  Pulse: 82 77  Resp: 11 12  Temp:      Last Pain:  Vitals:   01/12/17 1730  TempSrc:   PainSc: Asleep                 Hutson Luft

## 2017-01-12 NOTE — Op Note (Signed)
01/12/2017  4:51 PM  PATIENT:  Katrina Swanson  71 y.o. 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 [name] was positioned prone on the Wilson frame. Her back was prepped and draped in a sterile manner. All pressure points were properly padded. I infiltrated lidocaine into the planned incision in the lumbar region. I opened the skin with a 10 blade and dissected sharply to the thoracolumbar fascia. I exposed the L4, and L5 lamina bilaterally. I used the drill to perform semihemilaminectomies of L4 bilaterally. I removed the ligamentum flavum to expose the thecal sac bilaterally. I used the drill and Kerrison punches to perform partial facetectomies laterally in order to fully decompress the L4 roots bilaterally. I also used the Kerrison to decompress the L5 roots. Once I was satisfied with the decompression of the thecal sac and the L4, and L5 roots I started the closure. I approximated the thoracolumbar fascia with vicryl sutures.  I approximated the subcutaneous, and subcuticular layers with vicryl sutures. I applied dermabond for a sterile dressing.   PLAN OF CARE: Admit for overnight observation  PATIENT DISPOSITION:  PACU - hemodynamically stable.   Delay start of Pharmacological VTE agent (>24hrs) due to surgical blood loss or risk of bleeding:  yes

## 2017-01-12 NOTE — H&P (Signed)
BP (!) 134/102   Pulse 85   Temp 98.3 F (36.8 C) (Oral)   Resp 20   Wt 95.3 kg (210 lb)   SpO2 99%   BMI 29.71 kg/m  Mrs. Petzold comes in today for evaluation of pain which she has in the back. She has had it for years. I have seen her before in 2010. At that point she had an MRI that showed some mild stenosis present at L4-5, and a mildly degenerated disc, otherwise normal lordosis on the right. She had a normal neurologic exam at that time. She presents today again with a normal exam, with absolutely no lower extremity discomfort, though she does mention some numbness at times in the left lower extremity. She says she also has some occasions when her hands will start to shake. She says that she is unable to determine any type of pattern or cause of the shaking. If she stands or she walks, she will have a great deal of pain in the lower back. If she sits or lies down that pain will go away. She is currently retired. REVIEW OF SYSTEMS: Review of systems is positive for balance problems, hypercholesterolemia, swelling in the feet and hands, arthritis and thyroid disease. She says she is incontinent of bowel and/or bladder at times. She says outside of the sitting down nothing will relieve her pain. PAST MEDICAL HISTORY: Past medical history includes hypertension, diabetes, gastrointestinal problems, cerebrovascular accident. SOCIAL HISTORY: She does not use alcohol. She is right-handed. She does smoke. She does not use illicit drugs. CURRENT MEDICATIONS: Medications are Metformin, Pantoprazole, Oxybutynin, Tribenzor, Levothyroxine, and Atorvastatin. PHYSICAL EXAMINATION: Height 5 feet 4.5 inches, weight 192.6 pounds, blood pressure is 121/73, pulse 85, pain is 0/10, temperature is 97.2, respiratory rate is 12.  On exam she is alert, oriented x4, and answering all questions appropriately. Memory, language, attention span, and fund of knowledge is normal. Speech is clear, it is also fluent. Hearing  is intact to voice bilaterally. Uvula elevates in the midline. Shoulder shrug is normal. Tongue protrudes in the midline. 5/5 strength in both upper and lower extremities. 2+ reflexes at the knees and ankles. Gait is otherwise normal. Balance is normal. Muscle tone, bulk, and coordination is normal Ms Hobin is a patient of mine whom I have seen for low back pain. She had a little bit of stenosis and spondylitic change at L4-5. She has been undergoing epidural injections. However, she was taken to the emergency room for some problems with headaches and they thought she may have had a stroke. She underwent an MRI of the brain, which revealed 2 small aneurysms and then she had a catheter angiogram. 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 However, again, the angiogram itself seemed to suggest that there was a much larger aneurysm, which was identified on that. 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 As I explained to them, I would absolutely need to see the angiogram. What they will do is return to [place], obtain the angiogram, and bring it to [place]. MRI shows lumbar stenosis at L4-5, foraminal narrowing and lateral recess stenosis  bilaterally, but especially on the right side. She has, at this time, failed injections, failed conservative treatment, and would like to proceed with operative decompression. This would be done sometime after June 15. Risks and benefits of bleeding, infection, need for further surgery, no relief of pain, damage to the nerve roots, bowel, bladder dysfunction, and other risks were discussed. She would like to proceed. I still need to answer her  question about the "aneurysm."

## 2017-01-12 NOTE — Anesthesia Preprocedure Evaluation (Signed)
Anesthesia Evaluation  Patient identified by MRN, date of birth, ID band Patient awake    Reviewed: Allergy & Precautions, NPO status , Patient's Chart, lab work & pertinent test results  Airway Mallampati: II  TM Distance: >3 FB     Dental   Pulmonary asthma ,    breath sounds clear to auscultation       Cardiovascular hypertension,  Rhythm:Regular Rate:Normal     Neuro/Psych    GI/Hepatic negative GI ROS, Neg liver ROS,   Endo/Other  diabetesHypothyroidism   Renal/GU      Musculoskeletal   Abdominal   Peds  Hematology   Anesthesia Other Findings   Reproductive/Obstetrics                             Anesthesia Physical Anesthesia Plan  ASA: III  Anesthesia Plan: General   Post-op Pain Management:    Induction: Intravenous  PONV Risk Score and Plan: 2 and Ondansetron and Dexamethasone  Airway Management Planned: Oral ETT  Additional Equipment:   Intra-op Plan:   Post-operative Plan: Extubation in OR  Informed Consent: I have reviewed the patients History and Physical, chart, labs and discussed the procedure including the risks, benefits and alternatives for the proposed anesthesia with the patient or authorized representative who has indicated his/her understanding and acceptance.   Dental advisory given  Plan Discussed with: CRNA and Anesthesiologist  Anesthesia Plan Comments:         Anesthesia Quick Evaluation

## 2017-01-12 NOTE — Anesthesia Procedure Notes (Signed)
Procedure Name: Intubation Date/Time: 01/12/2017 2:48 PM Performed by: Rebekah Chesterfield L Pre-anesthesia Checklist: Patient identified, Emergency Drugs available, Suction available and Patient being monitored Patient Re-evaluated:Patient Re-evaluated prior to inductionOxygen Delivery Method: Circle System Utilized Preoxygenation: Pre-oxygenation with 100% oxygen Intubation Type: IV induction Ventilation: Mask ventilation without difficulty Laryngoscope Size: Mac, 3 and Glidescope Grade View: Grade IV Tube type: Oral Number of attempts: 3 Airway Equipment and Method: Stylet and Video-laryngoscopy Placement Confirmation: ETT inserted through vocal cords under direct vision,  positive ETCO2 and breath sounds checked- equal and bilateral Secured at: 21 cm Tube secured with: Tape Dental Injury: Teeth and Oropharynx as per pre-operative assessment and Bloody posterior oropharynx  Difficulty Due To: Difficulty was unanticipated, Difficult Airway- due to anterior larynx and Difficult Airway- due to immobile epiglottis Future Recommendations: Recommend- induction with short-acting agent, and alternative techniques readily available Comments: 1 st DL unable to visualize anything but epiglottis attempted repositioning an removal of pillow s improved view .   Mask ventilated, glide scope utilized with grade 2-3 view  ett through cords small amt heme noted on mac # 3 p visualization attempt

## 2017-01-12 NOTE — Transfer of Care (Signed)
Immediate Anesthesia Transfer of Care Note  Patient: Katrina Swanson  Procedure(s) Performed: Procedure(s) with comments: LAMINECTOMY AND FORAMINOTOMY LUMBAR 4- LUMBAR 5 (N/A) - LAMINECTOMY AND FORAMINOTOMY LUMBAR 4- LUMBAR 5  Patient Location: PACU  Anesthesia Type:General  Level of Consciousness: awake, alert , oriented and patient cooperative  Airway & Oxygen Therapy: Patient Spontanous Breathing and Patient connected to nasal cannula oxygen  Post-op Assessment: Report given to RN and Post -op Vital signs reviewed and stable  Post vital signs: Reviewed and stable  Last Vitals:  Vitals:   01/12/17 1108 01/12/17 1653  BP: (!) 134/102   Pulse:    Resp:    Temp:  (P) 36.4 C    Last Pain:  Vitals:   01/12/17 1106  TempSrc: Oral  PainSc:       Patients Stated Pain Goal: 4 (68/03/21 2248)  Complications: No apparent anesthesia complications

## 2017-01-13 ENCOUNTER — Encounter (HOSPITAL_COMMUNITY): Payer: Self-pay | Admitting: Neurosurgery

## 2017-01-13 DIAGNOSIS — M48062 Spinal stenosis, lumbar region with neurogenic claudication: Secondary | ICD-10-CM | POA: Diagnosis not present

## 2017-01-13 LAB — GLUCOSE, CAPILLARY
GLUCOSE-CAPILLARY: 192 mg/dL — AB (ref 65–99)
GLUCOSE-CAPILLARY: 144 mg/dL — AB (ref 65–99)

## 2017-01-13 MED ORDER — TIZANIDINE HCL 4 MG PO TABS: 4.0000 mg | ORAL_TABLET | Freq: Four times a day (QID) | ORAL | 0 refills | Status: DC | PRN

## 2017-01-13 MED ORDER — HYDROCODONE-ACETAMINOPHEN 5-325 MG PO TABS: 1.0000 | ORAL_TABLET | Freq: Four times a day (QID) | ORAL | 0 refills | Status: DC | PRN

## 2017-01-13 NOTE — Discharge Instructions (Signed)
Lumbar Discectomy °Care After °A discectomy involves removal of discmaterial (the cartilage-like structures located between the bones of the back). It is done to relieve pressure on nerve roots. It can be used as a treatment for a back problem. The time in surgery depends on the findings in surgery and what is necessary to correct the problems. °HOME CARE INSTRUCTIONS  °· Check the cut (incision) made by the surgeon twice a day for signs of infection. Some signs of infection may include:  °· A foul smelling, greenish or yellowish discharge from the wound.  °· Increased pain.  °· Increased redness over the incision (operative) site.  °· The skin edges may separate.  °· Flu-like symptoms (problems).  °· A temperature above 101.5° F (38.6° C).  °· Change your bandages in about 24 to 36 hours following surgery or as directed.  °· You may shower tomrrow.  Avoid bathtubs, swimming pools and hot tubs for three weeks or until your incision has healed completely. °· Follow your doctor's instructions as to safe activities, exercises, and physical therapy.  °· Weight reduction may be beneficial if you are overweight.  °· Daily exercise is helpful to prevent the return of problems. Walking is permitted. You may use a treadmill without an incline. Cut down on activities and exercise if you have discomfort. You may also go up and down stairs as much as you can tolerate.  °· DO NOT lift anything heavier than 10 to 15 lbs. Avoid bending or twisting at the waist. Always bend your knees when lifting.  °· Maintain strength and range of motion as instructed.  °· Do not drive for 10 days, or as directed by your doctors. You may be a passenger . Lying back in the passenger seat may be more comfortable for you. Always wear a seatbelt.  °· Limit your sitting in a regular chair to 20 to 30 minutes at a time. There are no limitations for sitting in a recliner. You should lie down or walk in between sitting periods.  °· Only take  over-the-counter or prescription medicines for pain, discomfort, or fever as directed by your caregiver.  °SEEK MEDICAL CARE IF:  °· There is increased bleeding (more than a small spot) from the wound.  °· You notice redness, swelling, or increasing pain in the wound.  °· Pus is coming from wound.  °· You develop an unexplained oral temperature above 102° F (38.9° C) develops.  °· You notice a foul smell coming from the wound or dressing.  °· You have increasing pain in your wound.  °SEEK IMMEDIATE MEDICAL CARE IF:  °· You develop a rash.  °· You have difficulty breathing.  °· You develop any allergic problems to medicines given.  °Document Released: 06/22/2004 Document Revised: 07/07/2011 Document Reviewed: 10/11/2007 °ExitCare® Patient Information °

## 2017-01-13 NOTE — Discharge Summary (Signed)
Physician Discharge Summary  Patient ID: Katrina Swanson MRN: [id number] DOB/AGE: 1946/06/20 71 y.o.  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 Post op she is able to ambulate, void, and tolerate a regular diet. Her wound is clean, dry, and without signs of infection.   Treatments: surgery: bilateral semihemilaminectomies for L4, and L5 nerve root decompression.   Discharge Exam: Blood pressure (!) 143/77, pulse 76, temperature 98.4 F (36.9 C), temperature source Oral, resp. rate 18, weight 95.3 kg (210 lb), SpO2 100 %. General appearance: alert, cooperative, appears stated age and no distress Neurologic: Alert and oriented X 3, normal strength and tone. Normal symmetric reflexes. Normal coordination and gait  Disposition: 01-Home or Self Care SPINAL STENOSIS, LUMBAR REGION WITH NEUROGENIC CLAUDICATION  Allergies as of 01/13/2017      Reactions   No Known Allergies       Medication List    TAKE these medications   albuterol 108 (90 Base) MCG/ACT inhaler Commonly known as:  PROVENTIL HFA;VENTOLIN HFA Inhale 2 puffs into the lungs every 4 (four) hours as needed for wheezing or shortness of breath.   aspirin EC 81 MG tablet Take 81 mg by mouth daily.   atorvastatin 80 MG tablet Commonly known as:  LIPITOR Take 80 mg by mouth at bedtime.   HYDROcodone-acetaminophen 5-325 MG tablet Commonly known as:  NORCO/VICODIN Take 1 tablet by mouth every 6 (six) hours as needed for moderate pain.   lactulose 10 GM/15ML solution Commonly known as:  CHRONULAC Take 15 mLs by mouth daily.   levocetirizine 5 MG  tablet Commonly known as:  XYZAL Take 5 mg by mouth daily as needed for allergies.   levothyroxine 75 MCG tablet Commonly known as:  SYNTHROID, LEVOTHROID Take 75 mcg by mouth daily before breakfast.   nitroGLYCERIN 0.4 MG SL tablet Commonly known as:  NITROSTAT Place 0.4 mg under the tongue every 5 (five) minutes as needed for chest pain.   pantoprazole 40 MG tablet Commonly known as:  PROTONIX Take 40 mg by mouth daily.   tiZANidine 4 MG tablet Commonly known as:  ZANAFLEX Take 1 tablet (4 mg total) by mouth every 6 (six) hours as needed for muscle spasms.   TRADJENTA 5 MG Tabs tablet Generic drug:  linagliptin Take 5 mg by mouth daily.   TRIBENZOR 20-5-12.5 MG Tabs Generic drug:  Olmesartan-Amlodipine-HCTZ Take 1 tablet by mouth daily.      Follow-up Information    Ashok Pall, MD Follow up in 3 week(s).   Specialty:  Neurosurgery Why:  please call the office to make an appointment Contact information: [address] [place] [address] [phone]           Signed: Winfield Cunas 01/13/2017, 2:18 PM

## 2017-01-13 NOTE — Progress Notes (Signed)
Pt doing well. Pt and family given D/C instructions with Rx's, verbal understanding was provided. Pt's incision is open to air and has no sign of infection. Pt's IV was removed prior to D/C. Pt D/C'd home via wheelchair @ 1440 per MD order. Pt is stable @ D/C and has no other needs at this time. Holli Humbles, RN

## 2017-08-25 ENCOUNTER — Encounter: Payer: Self-pay | Admitting: Podiatry

## 2017-08-25 ENCOUNTER — Ambulatory Visit (INDEPENDENT_AMBULATORY_CARE_PROVIDER_SITE_OTHER): Payer: Medicare Other

## 2017-08-25 ENCOUNTER — Ambulatory Visit: Payer: Medicare Other | Admitting: Podiatry

## 2017-08-25 ENCOUNTER — Telehealth: Payer: Self-pay | Admitting: *Deleted

## 2017-08-25 VITALS — BP 135/87 | HR 80 | Resp 18

## 2017-08-25 DIAGNOSIS — Z01812 Encounter for preprocedural laboratory examination: Secondary | ICD-10-CM

## 2017-08-25 DIAGNOSIS — D3610 Benign neoplasm of peripheral nerves and autonomic nervous system, unspecified: Secondary | ICD-10-CM

## 2017-08-25 DIAGNOSIS — M7752 Other enthesopathy of left foot: Secondary | ICD-10-CM | POA: Diagnosis not present

## 2017-08-25 DIAGNOSIS — M778 Other enthesopathies, not elsewhere classified: Secondary | ICD-10-CM

## 2017-08-25 DIAGNOSIS — G8929 Other chronic pain: Secondary | ICD-10-CM | POA: Diagnosis not present

## 2017-08-25 DIAGNOSIS — M779 Enthesopathy, unspecified: Principal | ICD-10-CM

## 2017-08-25 DIAGNOSIS — M79672 Pain in left foot: Secondary | ICD-10-CM | POA: Diagnosis not present

## 2017-08-25 NOTE — Telephone Encounter (Signed)
-----   Message from Trula Slade, DPM sent at 08/25/2017  9:13 AM EST ----- Can you please order an MRI of the left foot to rule out a neuroma (patient is requesting). She has seen another doctor and had steroid injections which help but wants to have a more definitive diagnosis.

## 2017-08-25 NOTE — Telephone Encounter (Signed)
Orders to J. Quintana, RN for pre-cert, faxed to [facility]. 

## 2017-08-27 NOTE — Progress Notes (Signed)
Subjective:   Patient ID: Katrina Swanson, female   DOB: 72 y.o.   MRN: [id number]   HPI 72 year old female presents the office today for concerns of left foot pain which is been ongoing for greater than 1 year.  She states that she has seen another podiatrist and she had 2 injections and she states it is between the third and fourth toe and this did help however her symptoms come back.  She is requesting an MRI as she was told the x-rays were negative.  She gets pain on a daily basis to walk in this area.  She denies any recent injury or trauma.  She denies any numbness or tingling to her toes.  No significant swelling that she has noticed.  No redness.  Pain does not wake her up at night.   Review of Systems  All other systems reviewed and are negative.  Past Medical History:  Diagnosis Date  . Asthma   . Diabetes mellitus without complication ([place])   . High cholesterol   . Hypertension   . Stroke ([place])    2012  . Thyroid disease    hypothyroid    Past Surgical History:  Procedure Laterality Date  . APPENDECTOMY    . CARDIAC CATHETERIZATION    . CARPAL TUNNEL RELEASE    . cataracts    . LEFT HEART CATHETERIZATION WITH CORONARY ANGIOGRAM N/A 04/29/2013   Procedure: LEFT HEART CATHETERIZATION WITH CORONARY ANGIOGRAM;  Surgeon: Peter M Martinique, MD;  Location: [facility] CATH LAB;  Service: Cardiovascular;  Laterality: N/A;  . LUMBAR LAMINECTOMY/DECOMPRESSION MICRODISCECTOMY N/A 01/12/2017   Procedure: LAMINECTOMY AND FORAMINOTOMY LUMBAR 4- LUMBAR 5;  Surgeon: Ashok Pall, MD;  Location: [place];  Service: Neurosurgery;  Laterality: N/A;  LAMINECTOMY AND FORAMINOTOMY LUMBAR 4- LUMBAR 5     Current Outpatient Medications:  .  albuterol (PROVENTIL HFA;VENTOLIN HFA) 108 (90 BASE) MCG/ACT inhaler, Inhale 2 puffs into the lungs every 4 (four) hours as needed for wheezing or shortness of breath., Disp: , Rfl:  .  aspirin EC 81 MG tablet, Take 81 mg by mouth daily., Disp: , Rfl:  .  atorvastatin  (LIPITOR) 80 MG tablet, Take 80 mg by mouth at bedtime. , Disp: , Rfl:  .  HYDROcodone-acetaminophen (NORCO/VICODIN) 5-325 MG tablet, Take 1 tablet by mouth every 6 (six) hours as needed for moderate pain., Disp: 28 tablet, Rfl: 0 .  lactulose (CHRONULAC) 10 GM/15ML solution, Take 15 mLs by mouth daily., Disp: , Rfl: 3 .  levocetirizine (XYZAL) 5 MG tablet, Take 5 mg by mouth daily as needed for allergies., Disp: , Rfl:  .  levothyroxine (SYNTHROID, LEVOTHROID) 75 MCG tablet, Take 75 mcg by mouth daily before breakfast., Disp: , Rfl:  .  linagliptin (TRADJENTA) 5 MG TABS tablet, Take 5 mg by mouth daily., Disp: , Rfl:  .  nitroGLYCERIN (NITROSTAT) 0.4 MG SL tablet, Place 0.4 mg under the tongue every 5 (five) minutes as needed for chest pain., Disp: , Rfl:  .  Olmesartan-Amlodipine-HCTZ (TRIBENZOR) 20-5-12.5 MG TABS, Take 1 tablet by mouth daily., Disp: , Rfl:  .  pantoprazole (PROTONIX) 40 MG tablet, Take 40 mg by mouth daily. , Disp: , Rfl:  .  tiZANidine (ZANAFLEX) 4 MG tablet, Take 1 tablet (4 mg total) by mouth every 6 (six) hours as needed for muscle spasms., Disp: 60 tablet, Rfl: 0  Allergies  Allergen Reactions  . No Known Allergies     Social History   Socioeconomic History  . Marital  status: Married    Spouse name: sammie  . Number of children: 3  . Years of education: 12th  . Highest education level: Not on file  Social Needs  . Financial resource strain: Not on file  . Food insecurity - worry: Not on file  . Food insecurity - inability: Not on file  . Transportation needs - medical: Not on file  . Transportation needs - non-medical: Not on file  Occupational History  . Occupation: retired  Tobacco Use  . Smoking status: Never Smoker  . Smokeless tobacco: Never Used  Substance and Sexual Activity  . Alcohol use: No  . Drug use: No  . Sexual activity: Yes    Partners: Male  Other Topics Concern  . Not on file  Social History Narrative   Patient lives with her  husband.   Patient is right handed   Patient drinks sodas        Objective:  Physical Exam  General: AAO x3, NAD  Dermatological: Skin is warm, dry and supple bilateral. Nails x 10 are well manicured; remaining integument appears unremarkable at this time. There are no open sores, no preulcerative lesions, no rash or signs of infection present.  Vascular: Dorsalis Pedis artery and Posterior Tibial artery pedal pulses are 2/4 bilateral with immedate capillary fill time. There is no pain with calf compression, swelling, warmth, erythema.   Neruologic: Grossly intact via light touch bilateral.  Protective threshold with Semmes Wienstein monofilament intact to all pedal sites bilateral.   Musculoskeletal: There is tenderness the third interspace of the left foot there is no palpable neuroma identified.  Mild discomfort submetatarsal 3 as well.  There is no area pinpoint bony tenderness or pain to vibratory sensation.  There is no significant edema there is no erythema or increase in warmth.  Muscular strength 5/5 in all groups tested bilateral.  Gait: Unassisted, Nonantalgic.       Assessment:   72 year old female with chronic left foot pain, likely bursitis versus neuroma third interspace     Plan:  -Treatment options discussed including all alternatives, risks, and complications -Etiology of symptoms were discussed -X-rays were obtained and reviewed with the patient.  There is no evidence of acute fracture identified.  No bony destruction present. -We discussed treatment options.  At this point she does not want another steroid injection.  She is requesting MRI and agree with this.  MRI was ordered to evaluate for neuroma or other mass into the interspace.  I do think her symptoms are biomechanical we discussed options but we will await the results of the MRI before proceeding with more definitive treatment.  She agrees to this plan.  Follow-up after MRI or sooner if needed.  Trula Slade DPM

## 2017-08-29 NOTE — Telephone Encounter (Signed)
Pt states she has an appt for the MRI. I told pt to call for an appt here about 3 days after getting the MRI. Pt states understanding.

## 2017-09-05 ENCOUNTER — Ambulatory Visit
Admission: RE | Admit: 2017-09-05 | Discharge: 2017-09-05 | Disposition: A | Discharge status: Home / Self Care | Payer: Medicare Other | Source: Ambulatory Visit | Attending: Podiatry | Admitting: Podiatry

## 2017-09-05 ENCOUNTER — Telehealth: Payer: Self-pay | Admitting: *Deleted

## 2017-09-05 DIAGNOSIS — D3610 Benign neoplasm of peripheral nerves and autonomic nervous system, unspecified: Secondary | ICD-10-CM

## 2017-09-05 MED ORDER — GADOBENATE DIMEGLUMINE 529 MG/ML IV SOLN
17.0000 mL | Freq: Once | INTRAVENOUS | Status: AC | PRN
  Administered 2017-09-05: 17 mL via INTRAVENOUS

## 2017-09-05 NOTE — Telephone Encounter (Signed)
I informed Katrina Swanson of Dr. Leigh Aurora review of results and recommendations. Katrina Swanson states understanding has an appt 09/12/2017 at 9:15am.

## 2017-09-05 NOTE — Telephone Encounter (Signed)
-----   Message from Trula Slade, DPM sent at 09/05/2017 12:41 PM EST ----- Please let her know that the MRI shoes some mild swelling and likely bursitis. There is no neuroma. That is likely why the injections she had from the other doctor helped. I think we need to work more on inserts/shoes to have take pressure off of the are to help prevent reoccurrence. Please have her come in to further discuss and we can work on this for her. Thanks.

## 2017-09-12 ENCOUNTER — Ambulatory Visit: Payer: Medicare Other | Admitting: Podiatry

## 2017-09-12 DIAGNOSIS — M7752 Other enthesopathy of left foot: Secondary | ICD-10-CM

## 2017-09-12 MED ORDER — DICLOFENAC SODIUM 1 % TD GEL
2.0000 g | Freq: Four times a day (QID) | TRANSDERMAL | 2 refills | Status: DC
Start: 2017-09-12 — End: 2017-09-18

## 2017-09-14 ENCOUNTER — Telehealth: Payer: Self-pay | Admitting: Podiatry

## 2017-09-14 MED ORDER — NONFORMULARY OR COMPOUNDED ITEM: 2 refills | Status: DC

## 2017-09-14 NOTE — Addendum Note (Signed)
Addended by: Harriett Sine D on: 09/14/2017 03:51 PM   Modules accepted: Orders

## 2017-09-14 NOTE — Telephone Encounter (Signed)
Dr. Leigh Aurora alternative pain cream from [facility] is ordered and faxed.

## 2017-09-14 NOTE — Telephone Encounter (Signed)
I called [id number] and requested the PA form to be sent to my fax.

## 2017-09-14 NOTE — Telephone Encounter (Signed)
I saw Dr. Jacqualyn Posey on Monday 12 February and he wrote me a prescription but the insurance didn't take care of it. I think they faxed it back to him. So I need to know what's going on or what type of medicine is he going to prescribe me. If someone could please call me and let me know. My number is [phone]. Thank you so much.

## 2017-09-14 NOTE — Telephone Encounter (Signed)
Pt asked the status of the PA on the cream Dr. Jacqualyn Posey had ordered and I told her I had requested the [id number] to fax the PA request form to my fax, but had not received the form. I told pt that in this case as well as cases when the cream had not been approved by [employer], Dr. Jacqualyn Posey prescribed a alternative pain medication cream. I offered this to pt and she accepted. I told pt I would get that cream out right away and it would cost a little over $38. Pt states the gel Dr. Jacqualyn Posey ordered will be $50.00.

## 2017-09-17 NOTE — Progress Notes (Signed)
Subjective: Katrina Swanson presents the office today to discuss MRI results.  She was to further discuss treatment options at this time.  She is presently getting orthotics and she is also does not want to have another injection at this point.  She states that overall her symptoms are about the same.  No recent injury and there is no swelling or redness that she has noticed to her feet. Denies any systemic complaints such as fevers, chills, nausea, vomiting. No acute changes since last appointment, and no other complaints at this time.   Objective: AAO x3, NAD DP/PT pulses palpable bilaterally, CRT less than 3 seconds Overall her symptoms are about the same without any significant improvement.  Her exam is unchanged.  There is tenderness of the third interspace left foot there is no palpable neuroma identified.  Minimal discomfort submetatarsal 3.  There is no overlying edema, erythema, increase in warmth.  There is no area pinpoint bony tenderness or pain to vibratory sensation. No open lesions or pre-ulcerative lesions.  No pain with calf compression, swelling, warmth, erythema  MRI 09/05/2017 IMPRESSION: 1. Slightly prominent soft tissue at the plantar aspect between the heads of the second and third and third and fourth metatarsals without pathologic enhancement after contrast. 2. Slight inflammation of the intermetatarsal bursa between the heads of the second and third metatarsals as indicated by enhancement after contrast administration. 3. No definable Morton's neuroma.  Assessment: Bursitis left foot  Plan: -All treatment options discussed with the patient including all alternatives, risks, complications.  -MRI results were discussed with the patient today.  See above. -She declined a steroid injection today.  She also wishes to hold off on orthotics.  I did prescribe Voltaren gel to see if this will help.  We will see her back at her request and she wants to come in but she was on her trip  to [place] to get a steroid injection likely.  I will see her back at her timeframe request or sooner if any issues are to arise.  She has no further questions today.  Also discussed physical therapy -Patient encouraged to call the office with any questions, concerns, change in symptoms.   Trula Slade DPM

## 2017-09-18 ENCOUNTER — Telehealth: Payer: Self-pay | Admitting: *Deleted

## 2017-09-18 MED ORDER — DICLOFENAC SODIUM 1 % TD GEL: 2.0000 g | Freq: Four times a day (QID) | TRANSDERMAL | 2 refills | Status: DC

## 2017-09-18 NOTE — Telephone Encounter (Signed)
I informed pt I had resent the diclofenac gel 1% to the [facility].

## 2017-09-18 NOTE — Telephone Encounter (Signed)
Pt states she would like to have the Diclofenac gel resent to [facility].

## 2017-10-05 ENCOUNTER — Encounter: Payer: Self-pay | Admitting: Podiatry

## 2017-10-05 ENCOUNTER — Ambulatory Visit: Payer: Medicare Other | Admitting: Podiatry

## 2017-10-05 DIAGNOSIS — M79672 Pain in left foot: Secondary | ICD-10-CM

## 2017-10-05 DIAGNOSIS — G8929 Other chronic pain: Secondary | ICD-10-CM

## 2017-10-05 DIAGNOSIS — M7752 Other enthesopathy of left foot: Secondary | ICD-10-CM

## 2017-10-05 DIAGNOSIS — E119 Type 2 diabetes mellitus without complications: Secondary | ICD-10-CM

## 2017-10-05 NOTE — Patient Instructions (Signed)
The code for the inserts is either L3020 (the one I usually use) or L3000  The diagnosis codes are: M77.52- bursitis M79.672- Chronic left foot pain Ell.9- type II diabetes mellitus without complicaiton

## 2017-10-08 NOTE — Progress Notes (Signed)
Subjective: Katrina Swanson presents the office today requesting steroid injection to her left foot.  She is given Reglan vacation and she wants to go ahead and get a steroid injection.  Overall she is unchanged in regards to symptoms.  She wears offloading pads intermittently.  No recent injury or trauma she denies any increase in swelling or redness.  She has no other concerns today to her feet. Denies any systemic complaints such as fevers, chills, nausea, vomiting. No acute changes since last appointment, and no other complaints at this time.   Objective: AAO x3, NAD DP/PT pulses palpable bilaterally, CRT less than 3 seconds On the left foot there is tenderness palpation of the second and third interspace.  There is no area pinpoint bony tenderness or pain with vibratory sensation.  There is no overlying edema, erythema, increased no other areas of tenderness identified bilaterally. No open lesions or pre-ulcerative lesions.  No pain with calf compression, swelling, warmth, erythema  Assessment: Bursitis left foot  Plan: -All treatment options discussed with the patient including all alternatives, risks, complications.  -Patient is requesting steroid injection today.  After the area was prepped with alcohol and mixture of a total of 1 cc Kenalog 10, 0.5% Marcaine plain, 0.5% 2% lidocaine plain was infiltrated into the second and third interspace without any complications.  Postinjection care was discussed.  She tolerated it well without any complications. -Continue with offloading pads and supportive shoes at all times. -She would like the codes for inserts and she is going to call her insurance company. Codes provided.  -Patient encouraged to call the office with any questions, concerns, change in symptoms.    Trula Slade DPM

## 2017-11-02 ENCOUNTER — Ambulatory Visit: Payer: Medicare Other | Admitting: Podiatry

## 2017-11-02 ENCOUNTER — Encounter: Payer: Self-pay | Admitting: Podiatry

## 2017-11-02 ENCOUNTER — Ambulatory Visit: Payer: Medicare Other | Admitting: Orthotics

## 2017-11-02 DIAGNOSIS — L603 Nail dystrophy: Secondary | ICD-10-CM

## 2017-11-02 DIAGNOSIS — M7752 Other enthesopathy of left foot: Secondary | ICD-10-CM

## 2017-11-02 DIAGNOSIS — L600 Ingrowing nail: Secondary | ICD-10-CM

## 2017-11-02 DIAGNOSIS — D3610 Benign neoplasm of peripheral nerves and autonomic nervous system, unspecified: Secondary | ICD-10-CM

## 2017-11-02 NOTE — Progress Notes (Signed)
Dawn to [employer] on insurance before ordering f/o.

## 2017-11-07 NOTE — Progress Notes (Signed)
Subjective: 72 year old female presents the office today for follow-up evaluation of left foot pain.  She states the injection did not help.  She has been molded for orthotics today with [facility].  However she states that she presents today because her left big toenails been causing some discomfort along the nail corner of the lateral aspect she denies any drainage or pus.  She said the area hurts with pressure in shoes.  No redness or drainage or any swelling that she has noticed.  She has no recent injury.  No other concerns.  She did not go on her recent trip to [place] because of her foot pain also to she had a UTI and she was on antibiotics.  Denies any systemic complaints such as fevers, chills, nausea, vomiting. No acute changes since last appointment, and no other complaints at this time.   Objective: AAO x3, NAD DP/PT pulses palpable bilaterally, CRT less than 3 seconds Overall the exam appears to be unchanged left foot there is tenderness along the forefoot still there is no area pinpoint bony tenderness or pain to vibratory sensation.  No significant edema, erythema, increase in warmth.  Her main concern is her left big toenail.  There is hypertrophy and dystrophy of the toenail is incurvation along the lateral aspect there is tenderness to palpation of this area.  There is no edema, erythema, drainage or pus or any clinical signs of infection noted today. No open lesions or pre-ulcerative lesions.  No pain with calf compression, swelling, warmth, erythema  Assessment: Left hallux ingrown toenail with ongoing foot pain  Plan: -All treatment options discussed with the patient including all alternatives, risks, complications.  -In regards to left foot pain she is amenable to for orthotics today.  She also is interested in diabetic shoes.  He will this concludes that the recertification of diabetic shoes. -Her main concern is her left big toenail.  I do recommend a partial nail avulsion of the  corner of the incurvated causing pain on the entire corner of the nail.  No signs of infection today.  She wishes to hold off on this and she will be rescheduled for this next week as she is a lot of incident this weekend.  Monitor for any signs or symptoms of infection. -Patient encouraged to call the office with any questions, concerns, change in symptoms.   Trula Slade DPM

## 2017-11-10 ENCOUNTER — Encounter: Payer: Self-pay | Admitting: Podiatry

## 2017-11-10 ENCOUNTER — Ambulatory Visit: Payer: Medicare Other | Admitting: Podiatry

## 2017-11-10 DIAGNOSIS — L600 Ingrowing nail: Secondary | ICD-10-CM | POA: Diagnosis not present

## 2017-11-10 NOTE — Patient Instructions (Signed)

## 2017-11-13 DIAGNOSIS — L600 Ingrowing nail: Secondary | ICD-10-CM | POA: Insufficient documentation

## 2017-11-13 NOTE — Progress Notes (Signed)
Subjective: 72 year old female presents the office today for follow-up evaluation of continued pain to the left big toenail along the lateral aspect of the nail border.  She states that she is been trying to pay attention and this is where the majority of her tenderness is localized and only wants this 1 corner trimmed out.  Denies any drainage or pus or any redness to the area to the area is painful with pressure in shoes on a ongoing basis. Denies any systemic complaints such as fevers, chills, nausea, vomiting. No acute changes since last appointment, and no other complaints at this time.   Objective: AAO x3, NAD DP/PT pulses palpable bilaterally, CRT less than 3 seconds Incurvation present to the lateral aspect the left hallux toenail with tenderness palpation of the nail corner.  There is minimal edema on the nail corner but there is no erythema or increase in warmth.  There is no ascending cellulitis.  No fluctuation or crepitation or malodor.  Mild ill-defined discoloration within the distal lateral portion of the nail as well.  There is no pain to the other areas of the toenail she reports.  No open lesions or pre-ulcerative lesions.  No pain with calf compression, swelling, warmth, erythema  Assessment: Ingrown toenail left lateral hallux toenail  Plan: -All treatment options discussed with the patient including all alternatives, risks, complications.  -At this time, the patient is requesting partial nail removal with chemical matricectomy to the symptomatic portion of the nail. Risks and complications were discussed with the patient for which they understand and written consent was obtained. Under sterile conditions a total of 3 mL of a mixture of 2% lidocaine plain and 0.5% Marcaine plain was infiltrated in a hallux block fashion. Once anesthetized, the skin was prepped in sterile fashion. A tourniquet was then applied. Next the lateral aspect of hallux nail border was then sharply excised  making sure to remove the entire offending nail border. Once the nails were ensured to be removed area was debrided and the underlying skin was intact. There is no purulence identified in the procedure. Next phenol was then applied under standard conditions and copiously irrigated. Silvadene was applied. A dry sterile dressing was applied. After application of the dressing the tourniquet was removed and there is found to be an immediate capillary refill time to the digit. The patient tolerated the procedure well any complications. Post procedure instructions were discussed the patient for which he verbally understood. Follow-up in one week for nail check or sooner if any problems are to arise. Discussed signs/symptoms of infection and directed to call the office immediately should any occur or go directly to the emergency room. In the meantime, encouraged to call the office with any questions, concerns, changes symptoms. -Patient encouraged to call the office with any questions, concerns, change in symptoms.   Trula Slade DPM

## 2017-11-27 ENCOUNTER — Ambulatory Visit (INDEPENDENT_AMBULATORY_CARE_PROVIDER_SITE_OTHER): Payer: Medicare Other | Admitting: Podiatry

## 2017-11-27 ENCOUNTER — Encounter: Payer: Self-pay | Admitting: Podiatry

## 2017-11-27 ENCOUNTER — Other Ambulatory Visit: Payer: Medicare Other | Admitting: Orthotics

## 2017-11-27 DIAGNOSIS — E119 Type 2 diabetes mellitus without complications: Secondary | ICD-10-CM | POA: Diagnosis not present

## 2017-11-27 DIAGNOSIS — M2041 Other hammer toe(s) (acquired), right foot: Secondary | ICD-10-CM

## 2017-11-27 DIAGNOSIS — M2042 Other hammer toe(s) (acquired), left foot: Secondary | ICD-10-CM | POA: Diagnosis not present

## 2017-11-27 DIAGNOSIS — G629 Polyneuropathy, unspecified: Secondary | ICD-10-CM

## 2017-11-27 DIAGNOSIS — L600 Ingrowing nail: Secondary | ICD-10-CM

## 2017-11-27 DIAGNOSIS — Z9889 Other specified postprocedural states: Secondary | ICD-10-CM

## 2017-11-27 NOTE — Addendum Note (Signed)
Addended by: Celesta Gentile R on: 11/27/2017 08:04 PM   Modules accepted: Level of Service

## 2017-11-27 NOTE — Progress Notes (Addendum)
Subjective: Katrina Swanson is a 72 y.o.  female returns to office today for follow up evaluation after having left Hallux paritla nail avulsion performed. Patient has been soaking using epsom satls and applying topical antibiotic covered with bandaid daily. She soaks it once a day. She still gets some tenderness to the area but improving. Patient denies fevers, chills, nausea, vomiting. Denies any calf pain, chest pain, SOB.   She is also requesting diabetic shoes.  She is diabetic and she gets numbness and tingling to her toes as well as given the foot pain that she was having she is hoping with a diabetic shoes will be helpful.  Objective:  Vitals: Reviewed  General: Well developed, nourished, in no acute distress, alert and oriented x3   Dermatology: Skin is warm, dry and supple bilateral. LEFT hallux nail border appears to be clean, dry, with mild granular tissue and surrounding scab. There is no surrounding erythema, edema, drainage/purulence. The remaining nails appear unremarkable at this time. There are no other lesions or other signs of infection present.  Neurovascular status:  hammertoes are present No lower extremity swelling; No pain with calf compression bilateral.  Subjectively she has numbness and tingling to her toes mostly with the left foot.  Musculoskeletal: Decreased tenderness to palpation of the lateral hallux nail fold. Muscular strength within normal limits bilateral. Hammertoes are present  Assesement and Plan: S/p partial nail avulsion, doing well.   -Continue soaking in epsom salts twice a day followed by antibiotic ointment and a band-aid. Can leave uncovered at night. Continue this until completely healed.  -I dispensed more metatarsal offloading pads as this has been helpful for her. She is unable to get orthotics due to cost but these have been helpful. Will continue with this for now.  -Given her numbness and tingling as well as diabetes and the pain to her  left foot I do think she will benefit from diabetic shoes and inserts.  Paperwork was completed states that there is some leg evaluated her for diabetic shoes. -If the area has not healed in 2 weeks, call the office for follow-up appointment, or sooner if any problems arise.  -Monitor for any signs/symptoms of infection. Call the office immediately if any occur or go directly to the emergency room. Call with any questions/concerns.  Celesta Gentile, DPM

## 2017-11-27 NOTE — Patient Instructions (Signed)

## 2018-01-02 ENCOUNTER — Ambulatory Visit: Payer: Medicare Other

## 2018-01-02 DIAGNOSIS — M2041 Other hammer toe(s) (acquired), right foot: Secondary | ICD-10-CM

## 2018-01-02 DIAGNOSIS — M2042 Other hammer toe(s) (acquired), left foot: Secondary | ICD-10-CM

## 2018-01-02 DIAGNOSIS — E1140 Type 2 diabetes mellitus with diabetic neuropathy, unspecified: Secondary | ICD-10-CM

## 2018-01-16 ENCOUNTER — Ambulatory Visit: Payer: Medicare Other | Admitting: Orthotics

## 2018-01-16 DIAGNOSIS — L600 Ingrowing nail: Secondary | ICD-10-CM

## 2018-01-16 DIAGNOSIS — G629 Polyneuropathy, unspecified: Secondary | ICD-10-CM

## 2018-01-16 DIAGNOSIS — E119 Type 2 diabetes mellitus without complications: Secondary | ICD-10-CM

## 2018-01-16 NOTE — Progress Notes (Signed)
Replacing shoes for one size wider.

## 2018-01-25 ENCOUNTER — Ambulatory Visit: Payer: Medicare Other | Admitting: Orthotics

## 2018-01-25 DIAGNOSIS — L600 Ingrowing nail: Secondary | ICD-10-CM

## 2018-01-25 DIAGNOSIS — M7752 Other enthesopathy of left foot: Secondary | ICD-10-CM

## 2018-01-25 DIAGNOSIS — G629 Polyneuropathy, unspecified: Secondary | ICD-10-CM

## 2018-01-25 NOTE — Progress Notes (Signed)
Picked up her reordered DB shoes (xw); seemed pleased.

## 2018-03-13 ENCOUNTER — Ambulatory Visit: Payer: Medicare Other | Admitting: Podiatry

## 2018-03-13 ENCOUNTER — Encounter: Payer: Self-pay | Admitting: Podiatry

## 2018-03-13 ENCOUNTER — Encounter

## 2018-03-13 DIAGNOSIS — L600 Ingrowing nail: Secondary | ICD-10-CM

## 2018-03-13 DIAGNOSIS — M7752 Other enthesopathy of left foot: Secondary | ICD-10-CM

## 2018-03-13 MED ORDER — TRIAMCINOLONE ACETONIDE 10 MG/ML IJ SUSP
10.0000 mg | Freq: Once | INTRAMUSCULAR | Status: AC
  Administered 2018-03-13: 10 mg

## 2018-03-14 NOTE — Progress Notes (Signed)
Subjective: 72 year old female presents the office today requesting injections of the left foot.  She states is been going on vacation next month and she was doing given her injection of it still painful.  She also states that the corner of the left big toenail started become somewhat painful she notices a piece of nail and skin on the corner but denies any drainage or pus or redness or swelling.  No recent injury.Denies any systemic complaints such as fevers, chills, nausea, vomiting. No acute changes since last appointment, and no other complaints at this time.   Objective: AAO x3, NAD DP/PT pulses palpable bilaterally, CRT less than 3 seconds Tenderness palpation to the second interspace in the left foot there is no area pinpoint tenderness and there is no pain to vibratory sensation.  There is no edema, erythema, increase in warmth.  No palpable neuroma is identified today. Small recurrence along the left hallux nail border along the area the previous partial nail avulsion.  Small spicule of nail present as well as some hyperkeratotic tissue.  Upon debridement there is no drainage or pus and there is no surrounding erythema, edema. No open lesions or pre-ulcerative lesions.  No pain with calf compression, swelling, warmth, erythema  Assessment: Tendinitis/bursitis  left foot with recurrent ingrown toenail  Plan: -All treatment options discussed with the patient including all alternatives, risks, complications.  -She will be debrided that.  Ingrown toenail with any complications or bleeding.  Discussed that after her vacation should we need to we can repeat the partial nail avulsion again if it becomes painful.  We will continue to monitor for now. -Steroid injection performed the left second interspace today without any complications.  The area was cleaned with alcohol and then a mixture of 1 cc Kenalog 20, 0.5% Marcaine plain and 0.5 cc of 2% lidocaine plain was infiltrated on the area without  any complications.  Postinjection care was discussed.  Dispensed metatarsal offloading pads. -Patient encouraged to call the office with any questions, concerns, change in symptoms.   Trula Slade DPM

## 2018-04-19 ENCOUNTER — Encounter: Payer: Self-pay | Admitting: Podiatry

## 2018-04-19 ENCOUNTER — Ambulatory Visit: Payer: Medicare Other | Admitting: Podiatry

## 2018-04-19 DIAGNOSIS — L600 Ingrowing nail: Secondary | ICD-10-CM

## 2018-04-19 DIAGNOSIS — B351 Tinea unguium: Secondary | ICD-10-CM

## 2018-04-19 NOTE — Patient Instructions (Signed)

## 2018-04-23 NOTE — Progress Notes (Signed)
Subjective: 72 year old female presents the office today for concerns of her left big toe and becoming thickened discolored nail starting to crack become ingrown again and she thinks that the entire toenail is to come off but she does not want to get off permanently to see if the nail growing back and will be helpful as far as the cosmetic appearance.  She states the nail does cause pain but denies any redness or drainage or any swelling.  She states the injection did help last appointment. Denies any systemic complaints such as fevers, chills, nausea, vomiting. No acute changes since last appointment, and no other complaints at this time.   Objective: AAO x3, NAD DP/PT pulses palpable bilaterally, CRT less than 3 seconds Left hallux toenail is dystrophic and discolored with yellow to brown discoloration.  There is tenderness the nail there is a horizontal according to the toenail.  There is certainly mild reoccurrence of the ingrown toenail there is no drainage or pus identified to the area today and there is no signs of infection.  No open lesions or pre-ulcerative lesions.  No pain with calf compression, swelling, warmth, erythema  Assessment: Onychodystrophy, ingrown toenail left  Plan: -All treatment options discussed with the patient including all alternatives, risks, complications.  -At this time, she is requesting total  nail removal without chemical matricectomy to the left hallux due to pain and thickening.  Discussed with her that taken in the office is not a guarantee that this is going to be helpful.. Risks and complications were discussed with the patient for which they understand and written consent was obtained.  Under sterile conditions a total of 3 mL of a mixture of 2% lidocaine plain and 0.5% Marcaine plain was infiltrated in a hallux block fashion. Once anesthetized, the skin was prepped in sterile fashion. A tourniquet was then applied. Next the left hallux nail  excised making  sure to remove the entire offending nail border. Once the nail was  Removed, the area was debrided and the underlying skin was intact. The area was irrigated and hemostasis was obtained.  A dry sterile dressing was applied. After application of the dressing the tourniquet was removed and there is found to be an immediate capillary refill time to the digit. The patient tolerated the procedure well any complications. Post procedure instructions were discussed the patient for which he verbally understood. Follow-up in one week for nail check or sooner if any problems are to arise. Discussed signs/symptoms of worsening infection and directed to call the office immediately should any occur or go directly to the emergency room. In the meantime, encouraged to call the office with any questions, concerns, changes symptoms. -Nails sent for culture to [facility] labs.  -Patient encouraged to call the office with any questions, concerns, change in symptoms.   Trula Slade DPM

## 2018-04-26 ENCOUNTER — Ambulatory Visit: Payer: Self-pay | Admitting: Podiatry

## 2018-04-26 DIAGNOSIS — L600 Ingrowing nail: Secondary | ICD-10-CM

## 2018-04-26 MED ORDER — CEPHALEXIN 500 MG PO CAPS: 500.0000 mg | ORAL_CAPSULE | Freq: Three times a day (TID) | ORAL | 0 refills | Status: DC

## 2018-04-26 NOTE — Progress Notes (Signed)
Subjective: Katrina Swanson is a 72 y.o.  female returns to office today for follow up evaluation after having left Hallux total temporary nail avulsion performed. Patient has been soaking using Epsom salts soaks although it is causing some burning and applying topical antibiotic covered with bandaid daily.  She said that she still having some difficulty not able to wear shoe completely because of the discomfort.  Denies any pus, redness or red streaks.  Patient denies fevers, chills, nausea, vomiting. Denies any calf pain, chest pain, SOB.   Objective:  Vitals: Reviewed  General: Well developed, nourished, in no acute distress, alert and oriented x3   Dermatology: Skin is warm, dry and supple bilateral.  Left hallux nail bed appears to be clean, dry, with mild granular tissue and surrounding scab. There is no minimal surrounding edema but there is no erythema, ascending cellulitis, drainage/purulence. The remaining nails appear unremarkable at this time. There are no other lesions or other signs of infection present.  Neurovascular status: Intact. No lower extremity swelling; No pain with calf compression bilateral.  Musculoskeletal: Mild tenderness to palpation of the left hallux nailbed. Muscular strength within normal limits bilateral.   Assesement and Plan: S/p partial nail avulsion, doing well.   -Continue soaking in epsom salts twice a day followed by antibiotic ointment and a band-aid. Can leave uncovered at night. Continue this until completely healed.  It is causing some irritation.  Consult she can use antibiotic ointment.  I do not think that the toe has any signs of infection but she is very concerned about this.  At this point start Keflex.  I will see her back in 2 to 3 weeks to see how she is doing or sooner if any issues are to arise and she agrees with this plan has no further questions.  Awaiting nail culture. -Monitor for any signs/symptoms of infection. Call the office  immediately if any occur or go directly to the emergency room. Call with any questions/concerns.  Celesta Gentile, DPM

## 2018-04-26 NOTE — Patient Instructions (Addendum)
ANTIBACTERIAL SOAP INSTRUCTIONS  THE DAY AFTER PROCEDURE  Please follow the instructions your doctor has marked.   Shower as usual. Before getting out, place a drop of antibacterial liquid soap (Dial) on a wet, clean washcloth.  Gently wipe washcloth over affected area.  Afterward, rinse the area with warm water.  Blot the area dry with a soft cloth and cover with antibiotic ointment (neosporin, polysporin, bacitracin) and band aid or gauze and tape  Place 3-4 drops of antibacterial liquid soap in a quart of warm tap water.  Submerge foot into water for 20 minutes.  If bandage was applied after your procedure, leave on to allow for easy lift off, then remove and continue with soak for the remaining time.  Next, blot area dry with a soft cloth and cover with a bandage.  Apply other medications as directed by your doctor, such as cortisporin otic solution (eardrops) or neosporin antibiotic ointment  Monitor for any signs/symptoms of infection. Call the office immediately if any occur or go directly to the emergency room. Call with any questions/concerns.  Please continue to soak until there is no pain, no redness and no drainage. Bandage on during the day and off at night. Report any signs of symptoms of infection, examples are: increased pain, increased swelling or drainage, increased redness. Please call with any questions or concerns

## 2018-05-08 ENCOUNTER — Telehealth: Payer: Self-pay | Admitting: *Deleted

## 2018-05-08 NOTE — Telephone Encounter (Signed)
-----   Message from Trula Slade, DPM sent at 05/08/2018 12:34 PM EDT ----- Please let her know that the nail culture did not show fungus and damage to the nail. We will need to watch the nail as it grows in. If it comes back thick then we can try urea cream but we need to see what happens as I removed it

## 2018-05-08 NOTE — Telephone Encounter (Signed)
Pt called for results, I informed pt of DR. Wagoner's review of results and orders. Pt states understanding.

## 2018-05-08 NOTE — Telephone Encounter (Signed)
Left message requesting call back to discuss results. 

## 2018-05-24 ENCOUNTER — Ambulatory Visit: Payer: Medicare Other | Admitting: Podiatry

## 2018-05-31 ENCOUNTER — Telehealth: Payer: Self-pay | Admitting: Internal Medicine

## 2018-05-31 NOTE — Telephone Encounter (Signed)
°  Pt c/o of Chest Pain: STAT if CP now or developed within 24 hours  1. Are you having CP right now? YES, TIGHTNESS  2. Are you experiencing any other symptoms (ex. SOB, nausea, vomiting, sweating)?HEADACHE, BP 99/62  3. How long have you been experiencing CP? 3 DAYS  4. Is your CP continuous or coming and going? COMING AND GOING  5. Have you taken Nitroglycerin? NO ?

## 2018-05-31 NOTE — Telephone Encounter (Signed)
Patient is having chest discomfort and tightness that comes and goes for the past 3 days. Patient also complaining of headache and low BP. Patient denies any SOB. Patient has an appointment in the am with Richardson Dopp PA. Encourage patient to go to ED if her symptoms get worse or nitroglycerin does not help.  Patient verbalized understanding. Will forward to [facility] PA, so he is aware.

## 2018-05-31 NOTE — Progress Notes (Signed)
Cardiology Office Note:    Date:  06/01/2018   ID:  Katrina Swanson, DOB 1946-06-29, MRN [id number]  PCP:  Joseph Art, MD  Cardiologist:  Dorris Carnes, MD  Electrophysiologist:  None   Referring MD: Joseph Art, MD   Chief Complaint  Patient presents with  . Chest Pain    History of Present Illness:    Katrina Swanson is a 72 y.o. female with diabetes, hypertension, hyperlipidemia, prior stroke, hypothyroidism, dizziness.  She has a history of a heart catheterization in 2014 demonstrating no significant CAD.  Stress testing in 2016 was low risk without significant ischemia.  Her ejection fraction by echocardiogram in 2015 was normal with mild diastolic dysfunction.  She was last seen by Dr. Harrington Challenger in March 2018.     Katrina Swanson returns for evaluation of chest pain.  Over the last couple of days, she has had fairly constant chest tightness.  She feels it up in her left shoulder.  She has been taking Gas-X without relief.  Taking Gas-X does cause significant belching.  She has not noticed any significant change in her symptoms with exertion.  She has not noticed any significant change in her symptoms with meals.  She has not had any shortness of breath, paroxysmal nocturnal dyspnea, syncope, lower extremity edema.  She denies pleuritic chest pain.  She has had a nonproductive cough.  She denies fever, melena, hematochezia.  She does feel somewhat tired.  Prior CV studies:   The following studies were reviewed today:  Nuclear stress test 04/16/2015 There is no significant reversible ischemia (there is actually reverse redistribution - rest images are worse than stress images). LVEF 69% with normal wall motion. PVC's and a period of ventricular bigeminy were noted during the study. This is a low risk study.  Carotid US 01/13/2014 Bilateral ICA 1-39  Echo 01/13/2014 EF 55-60, normal wall motion, grade 1 diastolic dysfunction  Cardiac catheterization 04/29/2013 LM normal LAD  normal LCx normal RCA mild irregularities <10 EF 55-65  Past Medical History:  Diagnosis Date  . Asthma   . Cardiac catheterization    LHC in 9/14:  no sig CAD (min irregs in RCA), EF 55-65  . Carotid Doppler ultrasound    Carotid US 6/15:  Bilateral ICA 1-39  . Diabetes mellitus without complication ([place])   . Echocardiogram    Echo 6/15: EF 55-60, normal wall motion, grade 1 diastolic dysfunction  . Hyperlipidemia   . Hypertension   . Hypothyroidism    hypothyroid  . Nuclear stress test    Nuc study 9/16: no ischemia, EF 69; low risk  . Stroke [facility])    2012   Surgical Hx: The patient  has a past surgical history that includes Appendectomy; Carpal tunnel release; cataracts; Cardiac catheterization; left heart catheterization with coronary angiogram (N/A, 04/29/2013); and Lumbar laminectomy/decompression microdiscectomy (N/A, 01/12/2017).   Current Medications: Current Meds  Medication Sig  . albuterol (PROVENTIL HFA;VENTOLIN HFA) 108 (90 BASE) MCG/ACT inhaler Inhale 2 puffs into the lungs every 4 (four) hours as needed for wheezing or shortness of breath.  Marland Kitchen aspirin EC 81 MG tablet Take 81 mg by mouth daily.  Marland Kitchen atorvastatin (LIPITOR) 80 MG tablet Take 80 mg by mouth at bedtime.   Marland Kitchen lactulose (CHRONULAC) 10 GM/15ML solution Take 15 mLs by mouth daily.  Marland Kitchen levothyroxine (SYNTHROID, LEVOTHROID) 75 MCG tablet Take 75 mcg by mouth daily before breakfast.  . metFORMIN (GLUCOPHAGE-XR) 500 MG 24 hr tablet Take 1,000 mg by mouth  2 (two) times daily.  . nitroGLYCERIN (NITROSTAT) 0.4 MG SL tablet Place 0.4 mg under the tongue every 5 (five) minutes as needed for chest pain.  . Olmesartan-Amlodipine-HCTZ (TRIBENZOR) 20-5-12.5 MG TABS Take 1 tablet by mouth daily.  . pantoprazole (PROTONIX) 40 MG tablet Take 40 mg by mouth daily.      Allergies:   No known allergies   Social History   Tobacco Use  . Smoking status: Never Smoker  . Smokeless tobacco: Never Used  Substance Use Topics   . Alcohol use: No  . Drug use: No     Family Hx: The patient's family history includes CAD in her father; Cancer in her maternal aunt; Diabetes Mellitus II in her mother.  ROS:   Please see the history of present illness.    Review of Systems  Constitution: Positive for malaise/fatigue.  Cardiovascular: Positive for chest pain.   All other systems reviewed and are negative.   EKGs/Labs/Other Test Reviewed:    EKG:  EKG is ordered today.  The ekg ordered today demonstrates normal sinus rhythm, heart rate 86, normal axis, nonspecific ST-T wave changes, PVC, QTC 454, no change when compared to prior tracings from 2016 and 2017  Recent Labs: No results found for requested labs within last 8760 hours.   Recent Lipid Panel Lab Results  Component Value Date/Time   CHOL 132 02/01/2016 08:17 AM   TRIG 149 02/01/2016 08:17 AM   HDL 39 (L) 02/01/2016 08:17 AM   CHOLHDL 3.4 02/01/2016 08:17 AM   LDLCALC 63 02/01/2016 08:17 AM    Physical Exam:    VS:  BP 138/60   Pulse 83   Ht 5' 4.5" (1.638 m)   Wt 189 lb 12.8 oz (86.1 kg)   SpO2 93%   BMI 32.08 kg/m     Wt Readings from Last 3 Encounters:  06/01/18 189 lb 12.8 oz (86.1 kg)  01/12/17 210 lb (95.3 kg)  01/05/17 210 lb 1.6 oz (95.3 kg)     Physical Exam  Constitutional: She is oriented to person, place, and time. She appears well-developed and well-nourished. No distress.  HENT:  Head: Normocephalic and atraumatic.  Eyes: No scleral icterus.  Neck: No JVD present. No thyromegaly present.  Cardiovascular: Normal rate. An irregular rhythm present.  No murmur heard. Pulmonary/Chest: Effort normal. She has no rales.  Abdominal: Soft.  Musculoskeletal: She exhibits no edema.  Lymphadenopathy:    She has no cervical adenopathy.  Neurological: She is alert and oriented to person, place, and time.  Skin: Skin is warm and dry.  Psychiatric: She has a normal mood and affect.    ASSESSMENT & PLAN:    Other chest  pain She presents today for evaluation of chest discomfort described as tightness that has been fairly persistent without relief over the past 2 days.  Her ECG does not demonstrate any significant changes when compared to old tracings.  There are no acute findings.  Her exam is normal.  She had a heart catheterization in 2014 that demonstrated no CAD.  Her last stress test in 2016 demonstrated no significant reversible ischemia.  She does have a lot of PVCs on exam.  She has a PVC noted on ECG today.  I question whether or not she may be experiencing symptoms related to this.  Some of her symptoms do sound gastrointestinal in nature as well.  -Arrange Lexiscan Myoview to rule out ischemia  -Obtain BMET, CBC  -Arrange 24-hour Holter  -Add Pepcid 20  mg daily to her medical regimen  -Start beta-blocker therapy with metoprolol succinate 25 mg daily  -Close follow-up in 2 weeks, sooner if needed  PVC's (premature ventricular contractions) She does not really complain of palpitations.  However, she does have several PVCs on exam as well as noted PVCs on ECG today.  Question if she may be experiencing symptoms from this.  We discussed placing her on a trial of beta-blocker therapy to see if this improves her symptoms.  -Arrange 24-hour Holter to assess PVC burden  -BMET, CBC today  -Start metoprolol succinate 25 mg daily, #30, 11 refills  Essential hypertension Blood pressure somewhat borderline.  Add metoprolol as noted.  Gastroesophageal reflux disease without esophagitis She has seen gastroenterology in the past.  She is on pantoprazole.  As noted, I will add Pepcid 20 mg daily.  If her cardiac work-up is unrevealing, she may need referral back to gastroenterology. Obtain BMET, CBC today.  Type 2 diabetes mellitus without complication, without long-term current use of insulin ([place]) Continue follow-up with primary care.   Dispo:  Return in about 2 weeks (around 06/15/2018) for Follow up after  testing, w/ Dr. Harrington Challenger, or Richardson Dopp, PA-C.   Medication Adjustments/Labs and Tests Ordered: Current medicines are reviewed at length with the patient today.  Concerns regarding medicines are outlined above.  Tests Ordered: Orders Placed This Encounter  Procedures  . Basic Metabolic Panel (BMET)  . CBC  . Holter monitor - 24 hour  . Myocardial Perfusion Imaging  . EKG 12-Lead   Medication Changes: Meds ordered this encounter  Medications  . famotidine (PEPCID) 20 MG tablet    Sig: Take 1 tablet (20 mg total) by mouth 2 (two) times daily.    Dispense:  30 tablet    Refill:  6  . metoprolol succinate (TOPROL XL) 25 MG 24 hr tablet    Sig: Take 1 tablet (25 mg total) by mouth daily.    Dispense:  30 tablet    Refill:  [address], Richardson Dopp, [place]  06/01/2018 1:23 PM    [facility] [place], [place], [place]  [address] Phone: [phone]; Fax: [phone]

## 2018-06-01 ENCOUNTER — Encounter: Payer: Self-pay | Admitting: *Deleted

## 2018-06-01 ENCOUNTER — Ambulatory Visit: Payer: Medicare Other | Admitting: Physician Assistant

## 2018-06-01 ENCOUNTER — Encounter: Payer: Self-pay | Admitting: Physician Assistant

## 2018-06-01 ENCOUNTER — Telehealth: Payer: Self-pay | Admitting: *Deleted

## 2018-06-01 VITALS — BP 138/60 | HR 83 | Ht 64.5 in | Wt 189.8 lb

## 2018-06-01 DIAGNOSIS — R0789 Other chest pain: Secondary | ICD-10-CM

## 2018-06-01 DIAGNOSIS — K219 Gastro-esophageal reflux disease without esophagitis: Secondary | ICD-10-CM

## 2018-06-01 DIAGNOSIS — I10 Essential (primary) hypertension: Secondary | ICD-10-CM

## 2018-06-01 DIAGNOSIS — I493 Ventricular premature depolarization: Secondary | ICD-10-CM

## 2018-06-01 DIAGNOSIS — E119 Type 2 diabetes mellitus without complications: Secondary | ICD-10-CM

## 2018-06-01 LAB — CBC
HEMATOCRIT: 36.8 % (ref 34.0–46.6)
Hemoglobin: 11.9 g/dL (ref 11.1–15.9)
MCH: 28.7 pg (ref 26.6–33.0)
MCHC: 32.3 g/dL (ref 31.5–35.7)
MCV: 89 fL (ref 79–97)
Platelets: 257 10*3/uL (ref 150–450)
RBC: 4.14 x10E6/uL (ref 3.77–5.28)
RDW: 12.7 % (ref 12.3–15.4)
WBC: 7.3 10*3/uL (ref 3.4–10.8)

## 2018-06-01 LAB — BASIC METABOLIC PANEL
BUN/Creatinine Ratio: 13 (ref 12–28)
BUN: 13 mg/dL (ref 8–27)
CALCIUM: 9.5 mg/dL (ref 8.7–10.3)
CO2: 25 mmol/L (ref 20–29)
Chloride: 99 mmol/L (ref 96–106)
Creatinine, Ser: 1.01 mg/dL — ABNORMAL HIGH (ref 0.57–1.00)
GFR, EST AFRICAN AMERICAN: 63 mL/min/{1.73_m2} (ref >59)
GFR, EST NON AFRICAN AMERICAN: 55 mL/min/{1.73_m2} — AB (ref >59)
Glucose: 91 mg/dL (ref 65–99)
POTASSIUM: 4.5 mmol/L (ref 3.5–5.2)
Sodium: 141 mmol/L (ref 134–144)

## 2018-06-01 MED ORDER — METOPROLOL SUCCINATE ER 25 MG PO TB24: 25.0000 mg | ORAL_TABLET | Freq: Every day | ORAL | 11 refills | Status: DC

## 2018-06-01 MED ORDER — FAMOTIDINE 20 MG PO TABS: 20.0000 mg | ORAL_TABLET | Freq: Two times a day (BID) | ORAL | 6 refills | Status: DC

## 2018-06-01 NOTE — Telephone Encounter (Signed)
See my office note from today. Richardson Dopp, PA-C    06/01/2018 1:24 PM

## 2018-06-01 NOTE — Telephone Encounter (Signed)
DPR ok to lmom. Lmom lab work looks good, continue as discussed at appt today. If any questions please call [phone].

## 2018-06-01 NOTE — Telephone Encounter (Signed)
-----   Message from Liliane Shi, [place] sent at 06/01/2018  3:44 PM EDT ----- Renal function stable. Hemoglobin normal. Potassium normal. Continue current medications and follow up as planned. Richardson Dopp, [place] - C 06/01/18, 15:46

## 2018-06-01 NOTE — Patient Instructions (Signed)
Medication Instructions:  1. START TOPROL XL 25 MG DAILY; RX HAS BEEN SENT IN   2. START PEPCID 20 MG EVERY NIGHT; RX HAS BEEN SENT IN  If you need a refill on your cardiac medications before your next appointment, please call your pharmacy.   Lab work: 1. TODAY BMET, CBC  If you have labs (blood work) drawn today and your tests are completely normal, you will receive your results only by: Marland Kitchen MyChart Message (if you have MyChart) OR . A paper copy in the mail If you have any lab test that is abnormal or we need to change your treatment, we will call you to review the results.  Testing/Procedures: 1. Your physician has recommended that you wear a 24 HOUR holter monitor. Holter monitors are medical devices that record the heart's electrical activity. Doctors most often use these monitors to diagnose arrhythmias. Arrhythmias are problems with the speed or rhythm of the heartbeat. The monitor is a small, portable device. You can wear one while you do your normal daily activities. This is usually used to diagnose what is causing palpitations/syncope (passing out).  2. Your physician has requested that you have a lexiscan myoview. For further information please visit [email or web address]. Please follow instruction sheet, as given.  Follow-Up: At [facility], you and your health needs are our priority.  As part of our continuing mission to provide you with exceptional heart care, we have created designated Provider Care Teams.  These Care Teams include your primary Cardiologist (physician) and Advanced Practice Providers (APPs -  Physician Assistants and Nurse Practitioners) who all work together to provide you with the care you need, when you need it. You will need a follow up appointment in:  3 weeks.  Please call our office 2 months in advance to schedule this appointment.  You may see Dorris Carnes, MD or [facility], [facility].   Any Other Special Instructions Will Be Listed Below (If  Applicable).

## 2018-06-11 ENCOUNTER — Encounter: Payer: Self-pay | Admitting: Physician Assistant

## 2018-06-14 ENCOUNTER — Encounter (HOSPITAL_COMMUNITY): Payer: Medicare Other

## 2018-06-22 ENCOUNTER — Ambulatory Visit: Payer: Medicare Other | Admitting: Physician Assistant

## 2018-07-09 ENCOUNTER — Encounter: Payer: Self-pay | Admitting: Podiatry

## 2018-07-09 ENCOUNTER — Ambulatory Visit (INDEPENDENT_AMBULATORY_CARE_PROVIDER_SITE_OTHER): Payer: Medicare Other | Admitting: Podiatry

## 2018-07-09 DIAGNOSIS — L600 Ingrowing nail: Secondary | ICD-10-CM | POA: Diagnosis not present

## 2018-07-09 NOTE — Patient Instructions (Signed)

## 2018-07-12 NOTE — Progress Notes (Signed)
Subjective: 72 year old female presents the office today for concerns of a return of the right second toe which is been painful in both corners.  She states she try to cut it out last week and made it more sore and tender and some burning to the nail corners of this taking into the skin.  Denies any drainage or pus coming from the area.  She has no other concerns.  Left-sided doing well. Denies any systemic complaints such as fevers, chills, nausea, vomiting. No acute changes since last appointment, and no other complaints at this time.   Objective: AAO x3, NAD DP/PT pulses palpable bilaterally, CRT less than 3 seconds Ingrowing present along the medial lateral aspect of the right second digit toenail with localized edema but there is no significant erythema there is no ascending cellulitis.  There is no fluctuation or crepitation any malodor.   Left hallux appears to be healed. No open lesions or pre-ulcerative lesions.  No pain with calf compression, swelling, warmth, erythema  Assessment: Right second digit and toenail  Plan: -All treatment options discussed with the patient including all alternatives, risks, complications.  -At this time, the patient is requesting partial nail removal with chemical matricectomy to the symptomatic portion of the nail. Risks and complications were discussed with the patient for which they understand and written consent was obtained. Under sterile conditions a total of 3 mL of a mixture of 2% lidocaine plain and 0.5% Marcaine plain was infiltrated in a digital block fashion. Once anesthetized, the skin was prepped in sterile fashion. A tourniquet was then applied. Next the medial and lateral aspect of the second digit nail border was then sharply excised making sure to remove the entire offending nail border. Once the nails were ensured to be removed area was debrided and the underlying skin was intact. There is no purulence identified in the procedure. Next phenol  was then applied under standard conditions and copiously irrigated. Silvadene was applied. A dry sterile dressing was applied. After application of the dressing the tourniquet was removed and there is found to be an immediate capillary refill time to the digit. The patient tolerated the procedure well any complications. Post procedure instructions were discussed the patient for which he verbally understood. Follow-up in one week for nail check or sooner if any problems are to arise. Discussed signs/symptoms of infection and directed to call the office immediately should any occur or go directly to the emergency room. In the meantime, encouraged to call the office with any questions, concerns, changes symptoms. -Patient encouraged to call the office with any questions, concerns, change in symptoms.   Trula Slade DPM

## 2018-09-03 ENCOUNTER — Encounter: Payer: Self-pay | Admitting: Internal Medicine

## 2018-09-14 ENCOUNTER — Ambulatory Visit: Payer: Medicare Other | Admitting: Podiatry

## 2018-09-14 ENCOUNTER — Encounter: Payer: Self-pay | Admitting: Podiatry

## 2018-09-14 DIAGNOSIS — M79674 Pain in right toe(s): Secondary | ICD-10-CM

## 2018-09-14 DIAGNOSIS — L600 Ingrowing nail: Secondary | ICD-10-CM | POA: Diagnosis not present

## 2018-09-14 MED ORDER — MUPIROCIN 2 % EX OINT: 1.0000 "application " | TOPICAL_OINTMENT | Freq: Two times a day (BID) | CUTANEOUS | 2 refills | Status: DC

## 2018-09-18 ENCOUNTER — Ambulatory Visit: Payer: Medicare Other | Admitting: Internal Medicine

## 2018-09-19 NOTE — Progress Notes (Signed)
Subjective: 73 year old female presents the office today for concerns of right second toe pain.  She states the procedure site healed well and does not look like it is ingrown however she is getting some discomfort at the base of the toenail at times.  She denies any redness or drainage or any swelling.  Pain is intermittent.  No recent injury. Denies any systemic complaints such as fevers, chills, nausea, vomiting. No acute changes since last appointment, and no other complaints at this time.   Objective: AAO x3, NAD DP/PT pulses palpable bilaterally, CRT less than 3 seconds The procedure site on the right second toenail partially well-healed.  She gets some discomfort at the base of the toenail.  The nail is elongated and somewhat loose and underlying nail bed distally but from adhered proximally.  There is no edema, erythema, increase in warmth there is no clinical signs of infection noted today. No open lesions or pre-ulcerative lesions.  No pain with calf compression, swelling, warmth, erythema  Assessment: Right second toenail pain  Plan: -All treatment options discussed with the patient including all alternatives, risks, complications.  -Part of the issues that her nails long.  Did debride the toenail with any complications or bleeding.  I want her to apply a small moderate antibiotic ointment on the base of the toenail.  There is no signs of infection however.  Continue to monitor.  From a procedure standpoint appears to be well-healed. -Patient encouraged to call the office with any questions, concerns, change in symptoms.   Trula Slade DPM

## 2018-09-24 ENCOUNTER — Ambulatory Visit: Payer: Medicare Other | Admitting: Internal Medicine

## 2018-09-24 ENCOUNTER — Encounter: Payer: Self-pay | Admitting: Internal Medicine

## 2018-09-24 VITALS — BP 118/68 | HR 91 | Ht 65.0 in | Wt 186.6 lb

## 2018-09-24 DIAGNOSIS — E119 Type 2 diabetes mellitus without complications: Secondary | ICD-10-CM | POA: Diagnosis not present

## 2018-09-24 LAB — GLUCOSE, POCT (MANUAL RESULT ENTRY): POC Glucose: 135 mg/dl — AB (ref 70–99)

## 2018-09-24 NOTE — Patient Instructions (Signed)
-   Metformin 500 mg XR two tablets Twice a day  - Check sugar fasting and bedtime  - Please let me know if you would like to see a dietician.

## 2018-09-24 NOTE — Progress Notes (Signed)
Name: Katrina Swanson  MRN/ DOB: [id number], [birth year]   Age/ Sex: 73 y.o., female    PCP: Joseph Art, MD   Reason for Endocrinology Evaluation: Type 2 Diabetes Mellitus     Date of Initial Endocrinology Visit: 09/24/2018     PATIENT IDENTIFIER: Katrina Swanson is a 73 y.o. female with a past medical history of HTN, T2DM, Dyslipidemia and hypothyroidism. The patient presented for initial endocrinology clinic visit on 09/24/2018 for consultative assistance with her diabetes management.    HPI: Katrina Swanson was    Diagnosed with T2DM  ~ 20 yrs  Prior Medications tried/Intolerance: Actos- weight gain. Trulicity - skin itching. Invokana- cost  Currently checking blood sugars 2 x / week,  before breakfast  Hypoglycemia episodes : 0       Hemoglobin A1c has ranged from 5.9% in 2019 , peaking at 6.2 % in 2020 Patient required assistance for hypoglycemia: no Patient has required hospitalization within the last 1 year from hyper or hypoglycemia: no  In terms of diet, the patient drinks sweet tea and juice. She eats 2 meals a day and snacks once a day.     Katrina Swanson with ingrown toe nails, she has had 2 surgeries on her toes in the past 4 months.    HOME DIABETES REGIMEN: Metformin 500 mg XR 2 Tbs BID    Statin: yes ACE-I/ARB: yes Prior Diabetic Education: no   METER DOWNLOAD SUMMARY: Did not bring  BG 113-119 mg/dL (memory recall)  DIABETIC COMPLICATIONS: Microvascular complications:    Denies: retinopathy, neuropathy, CKD  Last eye exam: Completed 08/2018  Macrovascular complications:   CVA (left hemiparesis )  Denies: CAD, PVD   PAST HISTORY: Past Medical History:  Past Medical History:  Diagnosis Date  . Asthma   . Cardiac catheterization    LHC in 9/14:  no sig CAD (min irregs in RCA), EF 55-65  . Carotid Doppler ultrasound    Carotid US 6/15:  Bilateral ICA 1-39  . Diabetes mellitus without complication ([place])   . Echocardiogram    Echo 6/15: EF  55-60, normal wall motion, grade 1 diastolic dysfunction  . Hyperlipidemia   . Hypertension   . Hypothyroidism    hypothyroid  . Nuclear stress test    Nuc study 9/16: no ischemia, EF 69; low risk  . Stroke [facility])    2012   Past Surgical History:  Past Surgical History:  Procedure Laterality Date  . APPENDECTOMY    . CARDIAC CATHETERIZATION    . CARPAL TUNNEL RELEASE    . cataracts    . LEFT HEART CATHETERIZATION WITH CORONARY ANGIOGRAM N/A 04/29/2013   Procedure: LEFT HEART CATHETERIZATION WITH CORONARY ANGIOGRAM;  Surgeon: Peter M Martinique, MD;  Location: [facility] CATH LAB;  Service: Cardiovascular;  Laterality: N/A;  . LUMBAR LAMINECTOMY/DECOMPRESSION MICRODISCECTOMY N/A 01/12/2017   Procedure: LAMINECTOMY AND FORAMINOTOMY LUMBAR 4- LUMBAR 5;  Surgeon: Ashok Pall, MD;  Location: [place];  Service: Neurosurgery;  Laterality: N/A;  LAMINECTOMY AND FORAMINOTOMY LUMBAR 4- LUMBAR 5      Social History:  reports that she has never smoked. She has never used smokeless tobacco. She reports that she does not drink alcohol or use drugs. Family History:  Family History  Problem Relation Age of Onset  . Diabetes Mellitus II Mother   . CAD Father   . Cancer Maternal Aunt      HOME MEDICATIONS: Allergies as of 09/24/2018      Reactions   Actos [pioglitazone]  Weight gain    Invokana [canagliflozin]    Hungry   No Known Allergies       Medication List       Accurate as of September 24, 2018 10:59 AM. Always use your most recent med list.        albuterol 108 (90 Base) MCG/ACT inhaler Commonly known as:  PROVENTIL HFA;VENTOLIN HFA Inhale 2 puffs into the lungs every 4 (four) hours as needed for wheezing or shortness of breath.   aspirin EC 81 MG tablet Take 81 mg by mouth daily.   atorvastatin 80 MG tablet Commonly known as:  LIPITOR Take 80 mg by mouth at bedtime.   famotidine 20 MG tablet Commonly known as:  PEPCID Take 1 tablet (20 mg total) by mouth 2 (two) times daily.    lactulose 10 GM/15ML solution Commonly known as:  CHRONULAC Take 15 mLs by mouth daily.   levothyroxine 75 MCG tablet Commonly known as:  SYNTHROID, LEVOTHROID Take 75 mcg by mouth daily before breakfast.   metFORMIN 500 MG 24 hr tablet Commonly known as:  GLUCOPHAGE-XR Take 1,000 mg by mouth 2 (two) times daily.   metoprolol succinate 25 MG 24 hr tablet Commonly known as:  TOPROL XL Take 1 tablet (25 mg total) by mouth daily.   mupirocin ointment 2 % Commonly known as:  BACTROBAN Apply 1 application topically 2 (two) times daily.   nitroGLYCERIN 0.4 MG SL tablet Commonly known as:  NITROSTAT Place 0.4 mg under the tongue every 5 (five) minutes as needed for chest pain.   pantoprazole 40 MG tablet Commonly known as:  PROTONIX Take 40 mg by mouth daily.   TRIBENZOR 20-5-12.5 MG Tabs Generic drug:  Olmesartan-amLODIPine-HCTZ Take 1 tablet by mouth daily.        ALLERGIES: Allergies  Allergen Reactions  . Actos [Pioglitazone]     Weight gain   . Invokana [Canagliflozin]     Hungry  . No Known Allergies      REVIEW OF SYSTEMS: A comprehensive ROS was conducted with the patient and is negative except as per HPI and below:  Review of Systems  Constitutional: Negative for fever.  HENT: Negative for congestion and sore throat.   Eyes: Positive for redness. Negative for blurred vision.  Respiratory: Negative for cough and shortness of breath.   Cardiovascular: Negative for chest pain and palpitations.  Gastrointestinal: Negative for diarrhea and nausea.  Genitourinary: Positive for frequency.  Skin: Negative.   Neurological: Positive for headaches. Negative for tingling and tremors.  Endo/Heme/Allergies: Negative for polydipsia.  Psychiatric/Behavioral: Negative for depression. The patient is not nervous/anxious.       OBJECTIVE:   VITAL SIGNS: BP 118/68 (BP Location: Left Arm, Patient Position: Sitting, Cuff Size: Normal)   Pulse 91   Ht 5\' 5"  (1.651 m)    Wt 186 lb 9.6 oz (84.6 kg)   SpO2 96%   BMI 31.05 kg/m    PHYSICAL EXAM:  General: Katrina Swanson appears well and is in NAD  Hydration: Well-hydrated with moist mucous membranes and good skin turgor  HEENT: Head: Unremarkable with good dentition. Oropharynx clear without exudate.  Eyes: External eye exam normal without stare, lid lag or exophthalmos.  EOM intact.  PERRL.  Neck: General: Supple without adenopathy or carotid bruits. Thyroid: Thyroid size normal.  No goiter or nodules appreciated. No thyroid bruit.  Lungs: Clear with good BS bilat with no rales, rhonchi, or wheezes  Heart: RRR with normal S1 and S2 and no gallops;  no murmurs; no rub  Abdomen: Normoactive bowel sounds, soft, nontender, without masses or organomegaly palpable  Extremities:  Lower extremities - No pretibial edema. No lesions.  Skin: Normal texture and temperature to palpation. No rash noted. No Acanthosis nigricans/skin tags. No lipohypertrophy.  Neuro: MS is good with appropriate affect, Katrina Swanson is alert and Ox3    DM foot exam: 09/24/18 The skin of the feet is intact without sores or ulcerations. Toe nails intact except for a small non-healing are at the left great toe. No discharge, swelling or erythema.  The pedal pulses are 2+ on right and 2+ on left. The sensation is intact to a screening 5.07, 10 gram monofilament bilaterally    DATA REVIEWED:  Lab Results  Component Value Date   HGBA1C 6.6 (H) 01/05/2017   HGBA1C 6.4 (H) 01/13/2014   09/03/2018 BUN/Cr 13/1.01 mg/dL  GFR 63  LDL 72 mg/dL   ASSESSMENT / PLAN / RECOMMENDATIONS:   1) Type 2 Diabetes Mellitus, Optimally- controlled, Without complications - Most recent A1c of 6.2 %. Goal A1c < 7.5 %.    Plan: GENERAL: I have discussed with the patient the pathophysiology of diabetes. We went over the natural progression of the disease. We talked about both insulin resistance and insulin deficiency. We stressed the importance of lifestyle changes including  diet and exercise. I explained the complications associated with diabetes including retinopathy, nephropathy, neuropathy as well as increased risk of cardiovascular disease. We went over the benefit seen with glycemic control.  Patient does not have history of hematological disorder, hence I do believe that her A1c is true reading.  She does admit to eating sweets here and there, she does tend to snack on fruits, I would still recommend that she monitors her carbohydrate intake despite her current excellent glucose control, I offered a referral to a CDE but patient declined at this point.  Reassurance provided that her glucose control is optimal, metformin typically does not cause hypoglycemia, so she can tolerate a low A1c.  The concern of low A1c is only inpatient who are on medications that would cause hypoglycemia such as insulin or sulfonylureas which neither the patient is on.  MEDICATIONS:  Continue metformin 500 mg XR 2 tablets twice a day  EDUCATION / INSTRUCTIONS:  BG monitoring instructions: Patient is instructed to check her blood sugars 2 times a day, fasting and bedtime.  Call [facility] if: BG persistently < 70 or > 300. . I reviewed the Rule of 15 for the treatment of hypoglycemia in detail with the patient. Literature supplied.   2) Diabetic complications:   Eye: Does not have known diabetic retinopathy.   Neuro/ Feet: Does not have known diabetic peripheral neuropathy.  Renal: Patient does not have known baseline CKD. She is on an ACEI/ARB at present.   3) Lipids: Patient is on a statin.    4) Hypertension: She is at goal of < 140/90 mmHg.    Patient will be referred back to her PCP, I will be happy to see her in the future if needed.     F/U PRN   Signed electronically by: Mack Guise, MD  [facility] End[facility]  [address] [place], [place] [address] Phone: [phone] FAX:  [phone]   CC: Joseph Art, MD [place] [address] [place] [address] Phone: [phone]  Fax: [phone]    Return to Endocrinology clinic as below: No future appointments.

## 2018-11-22 ENCOUNTER — Telehealth: Payer: Self-pay | Admitting: Internal Medicine

## 2018-11-22 NOTE — Telephone Encounter (Signed)
Called pt to set up appt. She says that she does not need an appt right now she is having issues with her feet and she has appts lined up with the foot Dr. And she has to get that out the way first. She says if she needs an appt, she will call in a few months for an in office visit.

## 2018-12-07 ENCOUNTER — Telehealth: Payer: Self-pay | Admitting: *Deleted

## 2018-12-07 NOTE — Telephone Encounter (Signed)
Pt states her toe is still painful, but looks fine.

## 2018-12-07 NOTE — Telephone Encounter (Signed)
I spoke with pt after she was scheduled to be seen on 12/11/2018 and told her to continue the soaks once daily with the antibiotic ointment after and to allow to dry in the evening and wear loose or open shoes and if able to tolerate ibuprofen could take as the package directs with food.

## 2018-12-11 ENCOUNTER — Other Ambulatory Visit: Payer: Self-pay

## 2018-12-11 ENCOUNTER — Ambulatory Visit: Payer: Medicare Other | Admitting: Podiatry

## 2018-12-11 VITALS — Temp 97.7°F

## 2018-12-11 DIAGNOSIS — M7752 Other enthesopathy of left foot: Secondary | ICD-10-CM | POA: Diagnosis not present

## 2018-12-11 DIAGNOSIS — M79674 Pain in right toe(s): Secondary | ICD-10-CM

## 2018-12-11 MED ORDER — TRIAMCINOLONE ACETONIDE 10 MG/ML IJ SUSP
10.0000 mg | Freq: Once | INTRAMUSCULAR | Status: AC
  Administered 2018-12-11: 10 mg

## 2018-12-17 NOTE — Progress Notes (Signed)
Subjective: 73 year old female presents the office today for concerns of intermittent pain still to the right second toe.  She states that when you get the toe it looks good but she is having pain at the base of the toenail.  She occasionally some swelling but she denies any drainage or pus or any redness or red streaks.  She is also asking for steroid injection into the left foot as she started to get reoccurrence of pain to the forefoot as she was having before.  No recent injury or trauma any changes. Denies any systemic complaints such as fevers, chills, nausea, vomiting. No acute changes since last appointment, and no other complaints at this time.   Objective: AAO x3, NAD DP/PT pulses palpable bilaterally, CRT less than 3 seconds There is tenderness on the second and third interspaces of the left foot.  There is no palpable neuroma identified.  There is no area pinpoint tenderness.  No edema, erythema. The right second digit toenail somewhat split.  Mildly hypertrophic, the other toenails.  There is no pain to the area today and there is no edema, erythema.  No signs of infection.  She is been keeping antibiotic ointment on it. No open lesions or pre-ulcerative lesions.  No pain with calf compression, swelling, warmth, erythema  Assessment: Left foot bursitis/tendonitis; right second toe pain  Plan: -All treatment options discussed with the patient including all alternatives, risks, complications.  -We discussed nail removal but she declines this.  I want her to cuticle cream on the right second toenail.  Monitor for any signs or symptoms of infection.  I did as a courtesy debride the toenail with any complications as it was elongated and mildly hypertrophic compared to the other nails. -Steroid injection along the left foot.  Skin was prepped with alcohol and mixture of 2 cc of 1 cc Kenalog 10, 0.5 cc of Marcaine plain and 0.5 cc of lidocaine plain was infiltrated into the second and third  interspaces without complications.  She tolerated well.  Continue offloading.  She modifications and orthotics. -Patient encouraged to call the office with any questions, concerns, change in symptoms.   Trula Slade DPM

## 2019-01-28 ENCOUNTER — Other Ambulatory Visit: Payer: Self-pay | Admitting: Neurosurgery

## 2019-01-28 DIAGNOSIS — I671 Cerebral aneurysm, nonruptured: Secondary | ICD-10-CM

## 2019-02-11 ENCOUNTER — Ambulatory Visit
Admission: RE | Admit: 2019-02-11 | Discharge: 2019-02-11 | Disposition: A | Discharge status: Home / Self Care | Payer: Medicare Other | Source: Ambulatory Visit | Attending: Neurosurgery | Admitting: Neurosurgery

## 2019-02-11 ENCOUNTER — Other Ambulatory Visit: Payer: Self-pay

## 2019-02-11 DIAGNOSIS — I671 Cerebral aneurysm, nonruptured: Secondary | ICD-10-CM

## 2019-02-11 IMAGING — CT CT ANGIOGRAPHY HEAD
2 of 4 series · 6 of 32 positions shown, 11 images · IV contrast (iopamidol)
Comparison: 07/05/2016.  07/03/2016.  07/02/2016.

CLINICAL DATA: Posterior headaches on a daily basis over the last
3-4 weeks. Assess for aneurysm.

Creatinine was obtained on site at [HOSPITAL] at [HOSPITAL].
Results: Creatinine 1.1 mg/dL.
EXAM:
CT ANGIOGRAPHY HEAD
TECHNIQUE: Multidetector CT imaging of the head was performed using the
standard protocol during bolus administration of intravenous
contrast. Multiplanar CT image reconstructions and MIPs were
obtained to evaluate the vascular anatomy.
CONTRAST:  75mL [id number] IOPAMIDOL ([id number]) INJECTION 76%

[Series 4: head w/(date) · axial · 0.41mm/px · z∈[-122,-67]mm · 2 of 33 slices shown]
[im 11/33  soft-tissue]
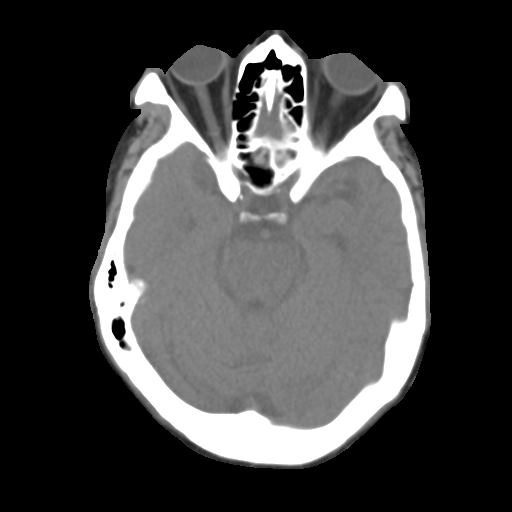
[im 22/33  soft-tissue]
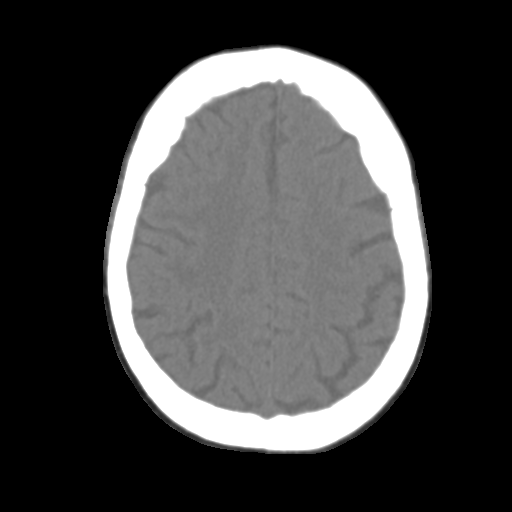

[Series 7: head angio · axial · 0.42mm/px · z∈[-145,-55]mm · 4 of 52 slices shown, 9 images]
[im 11/52  soft-tissue]
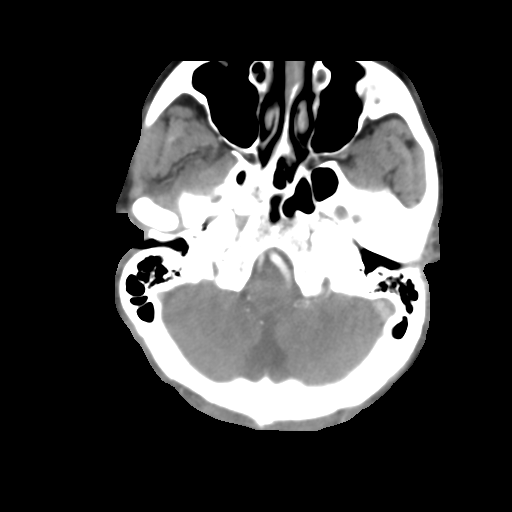
[im 11/52  lung]
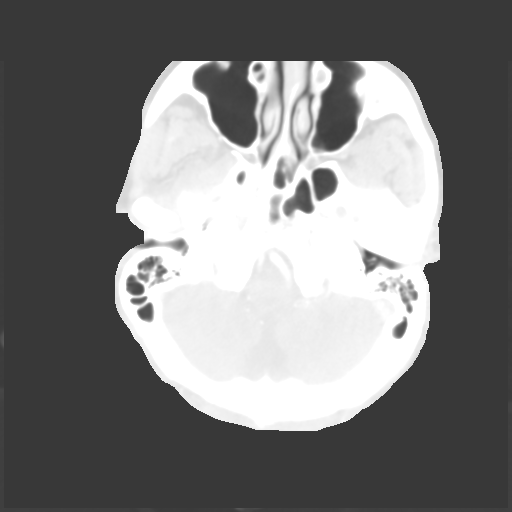
[im 11/52  bone]
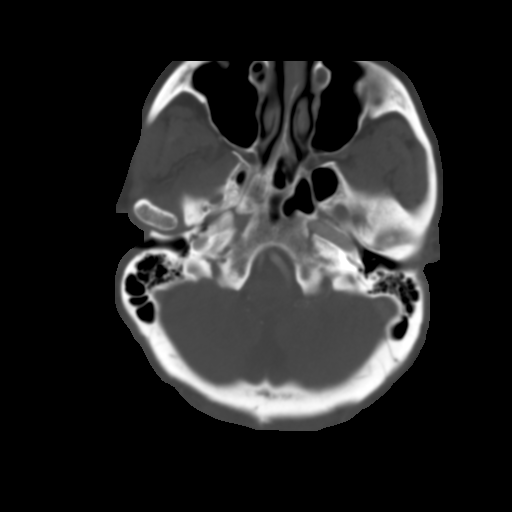
[im 21/52  soft-tissue]
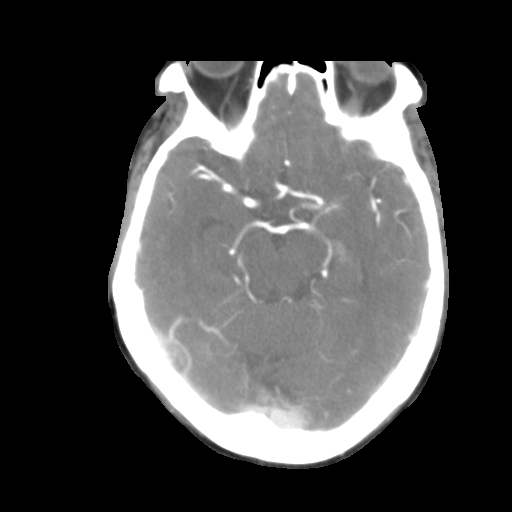
[im 21/52  lung]
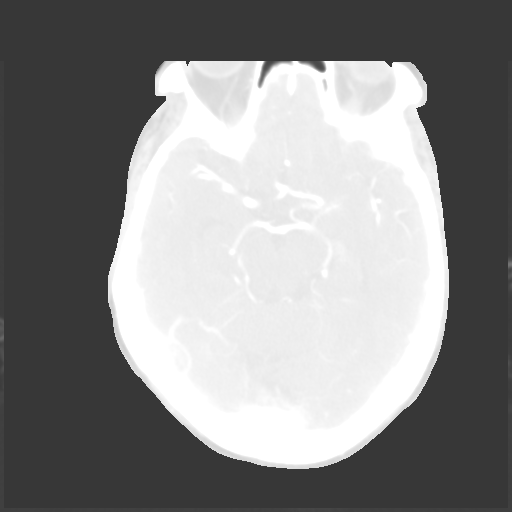
[im 31/52  soft-tissue]
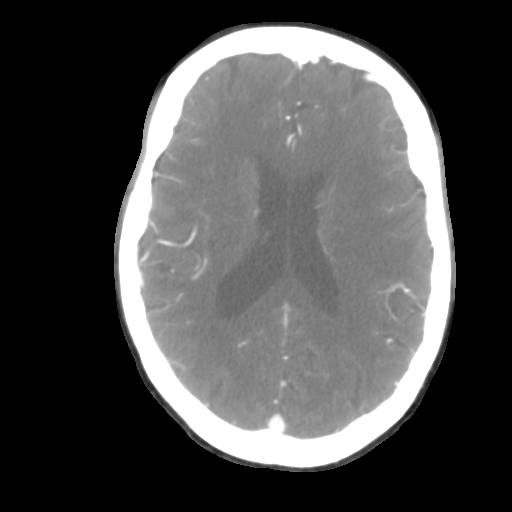
[im 31/52  lung]
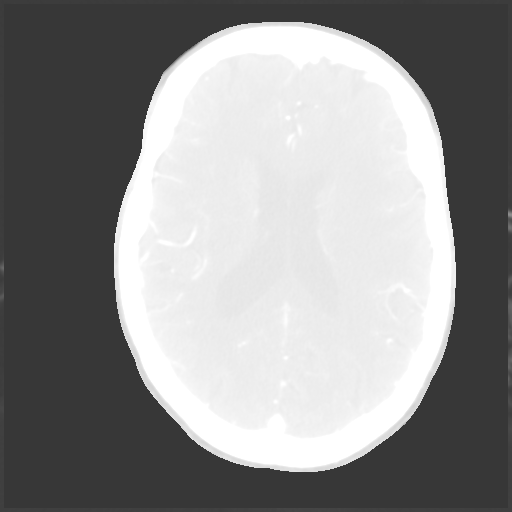
[im 41/52  soft-tissue]
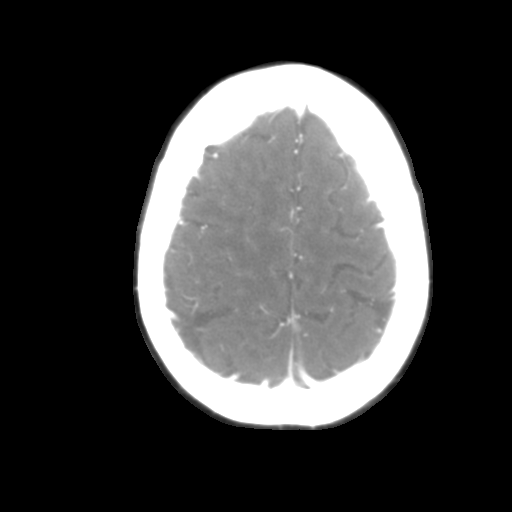
[im 41/52  lung]
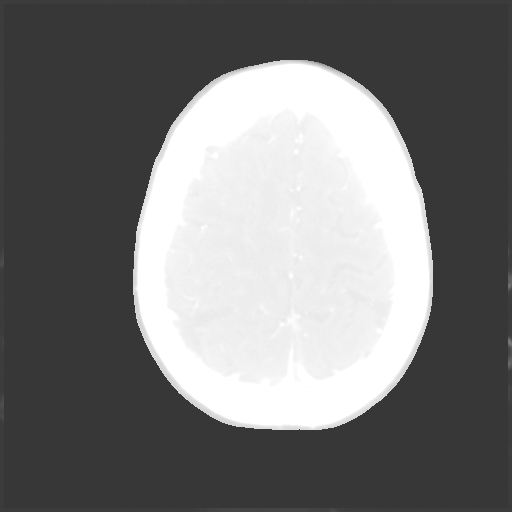

[6 of 32 positions shown; findings below may reference images not displayed]

FINDINGS: CT HEAD

Brain: The brainstem and cerebellum are normal. Question newly seen
low-density in the left occipital lobe that could be due to a recent
left occipital infarction. This is not absolutely certain. The
remainder of the cerebral hemispheres show mild chronic small-vessel
ischemic change of the white matter without other finding. No
hemorrhage, hydrocephalus or extra-axial collection.

Vascular: There is atherosclerotic calcification of the major
vessels at the base of the brain.

Skull: Negative

Sinuses: Clear

Orbits: Normal

CTA HEAD

Anterior circulation: Right internal carotid artery is patent
through the siphon region with ordinary nonstenotic atherosclerotic
calcification. Right anterior and middle cerebral vessels are
normal.

Left internal carotid artery is patent through the carotid siphon
region with atherosclerotic calcification and a sessile medially
projecting aneurysm of the proximal siphon with a broad base of 4 mm
and a depth of 2 mm. Second smaller medially projecting
atherosclerotic aneurysm at the apex of the left carotid siphon
measuring 2 mm at both the mouth and depth. The anterior and middle
cerebral vessels are patent. Bilateral posterior communicating
arteries are patent.

Posterior circulation: Both vertebral arteries are patent through
the foramen magnum with the left being dominant. No basilar
stenosis. Posterior circulation branch vessels are normal.

Venous sinuses: Patent and normal.

Anatomic variants: None significant

Delayed phase: Not performed.
IMPRESSION: Atherosclerotic disease of both carotid siphon regions. Shallow
aneurysm projecting medially from the proximal siphon on the left
with a mouth 4 mm and a depth of 2 mm. Second smaller medially
projecting aneurysm at the apex of the carotid siphon on the left
measuring 2 mm at the mouth and depth. No apparent change from
previous studies. No intracranial branch vessel stenosis or
occlusion.

Head CT raises the possibility of an acute or subacute left
occipital infarction. Does the patient have visual symptoms?

## 2019-02-11 MED ORDER — IOPAMIDOL (ISOVUE-370) INJECTION 76%
75.0000 mL | Freq: Once | INTRAVENOUS | Status: AC | PRN
  Administered 2019-02-11: 75 mL via INTRAVENOUS

## 2019-04-01 ENCOUNTER — Telehealth: Payer: Self-pay | Admitting: Podiatry

## 2019-04-01 NOTE — Telephone Encounter (Signed)
Pt called saying she wants Dr. Jacqualyn Posey to figure out what is wrong with her toe, why it keeps hurting her. Stated she was not going to come back down here and pay her $35 co-pay for him to not tell her anything. States her toe is still throbbing in pain and shooting pain. Requested Dr. Jacqualyn Posey give her a call.

## 2019-04-02 NOTE — Telephone Encounter (Signed)
I asked pt to describe what was going on with her toe at this point. Pt states she can be just sitting and suddenly a sharp pain will shoot through her toe, happens about once every other day, you can mash the toe and it doesn't hurt, but it will still have a un-triggered sharp pain shoot through it and the toe looks good.

## 2019-04-03 NOTE — Telephone Encounter (Signed)
Left message requesting information concerning when in the day the shooting pain occurs.

## 2019-04-03 NOTE — Telephone Encounter (Signed)
Can you ask her if there is a certain time of day she gets it more? It sounds more like nerve pain to me. We could try a topical or oral gabapentin to help with the nerve pain.

## 2019-11-15 ENCOUNTER — Telehealth: Payer: Self-pay | Admitting: Internal Medicine

## 2019-11-15 NOTE — Telephone Encounter (Signed)
QUestion if she would be willing to be seen in [place]?   If not virtual   Not optimal since last seen 2019

## 2019-11-15 NOTE — Telephone Encounter (Signed)
[place] from Dr. Rip Harbour office the patient's PCP is calling stating Katrina Swanson came in on 11/12/2019 requesting a Stress Test and Echo due to SOB. Dr. Marjory Lies advised the patient she has no problem putting in the request, but feels she should follow up with her Cardiologist in regards to this. Please advise.

## 2019-11-15 NOTE — Telephone Encounter (Signed)
Called patient, she was seen in PCP office last week.  She denies being SOB.  States is tired.  Said she always complains about being tired but it has been worse in the last week or so.  Has to sit down after walking to mailbox and back.  Denies CP, but sometimes it gets tight.  Pt reluctant to schedule appointment due to cost and having to "drive all the way there".  I offered virtual.  Pt requesting in office and w/ Richardson Dopp and before noon only.  Soonest available is 11/26/19.  Adv I would req notes from her PCP visit.  I left VM for Dr. Rip Harbour nurses VM requesting ov notes and any other pertinent records be faxed to [phone].  She said that since PCP already said she needs these tests, why can't we just do them.   The pt was last seen in cardiology 06/01/2018.

## 2019-11-25 NOTE — Progress Notes (Addendum)
Cardiology Office Note:    Date:  11/26/2019   ID:  Katrina Swanson, DOB Apr 04, 1946, MRN [id number]  PCP:  Joseph Art, MD  Cardiologist:  Dorris Carnes, MD   Electrophysiologist:  None   Referring MD: Joseph Art, MD   Chief Complaint:  Fatigue and Chest Pain    Patient Profile:    Katrina Swanson is a 74 y.o. female with:   Cardiac catheterization in 2014: no CAD  Echocardiogram 2015: normal EF  Myoview 2016: low risk, no ischemia  Diabetes mellitus   Hypertension   Hyperlipidemia  Hx of CVA  Hypothyroidism   OSA  Prior CV studies: Nuclear stress test 04/16/2015 There is no significant reversible ischemia (there is actually reverse redistribution - rest images are worse than stress images). LVEF 69% with normal wall motion. PVC's and a period of ventricular bigeminy were noted during the study. This is a low risk study.  Carotid US 01/13/2014 Bilateral ICA 1-39  Echo 01/13/2014 EF 55-60, normal wall motion, grade 1 diastolic dysfunction  Cardiac catheterization 04/29/2013 LM normal LAD normal LCx normal RCA mild irregularities <10 EF 55-65  History of Present Illness:    Katrina Swanson was last seen in clinic in November 2019.  She called the office recently with symptoms of fatigue.  She also noted some shortness of breath.  She returns for evaluation.  She has been fatigued for a long time, but it has recently gotten worse.  She has to stop to rest after doing most activities at home.  She has to stop going up her steps off her back porch.  She has not had chest pain, syncope, orthopnea, leg swelling.   She has occasional dizziness.  This has been going on for years.  She often has to sit down due to blurry vision related to this.  This symptom is somewhat random and has not changed.       Past Medical History:  Diagnosis Date  . Asthma   . Cardiac catheterization    LHC in 9/14:  no sig CAD (min irregs in RCA), EF 55-65  . Carotid Doppler ultrasound     Carotid US 6/15:  Bilateral ICA 1-39  . Diabetes mellitus without complication ([place])   . Echocardiogram    Echo 6/15: EF 55-60, normal wall motion, grade 1 diastolic dysfunction  . Hyperlipidemia   . Hypertension   . Hypothyroidism    hypothyroid  . Nuclear stress test    Nuc study 9/16: no ischemia, EF 69; low risk  . Stroke [facility])    2012    Current Medications: Current Meds  Medication Sig  . albuterol (PROVENTIL HFA;VENTOLIN HFA) 108 (90 BASE) MCG/ACT inhaler Inhale 2 puffs into the lungs every 4 (four) hours as needed for wheezing or shortness of breath.  Marland Kitchen atorvastatin (LIPITOR) 80 MG tablet Take 80 mg by mouth at bedtime.   . famotidine (PEPCID) 40 MG tablet Take 40 mg by mouth daily.  Marland Kitchen lactulose (CHRONULAC) 10 GM/15ML solution Take 15 mLs by mouth daily.  Marland Kitchen levothyroxine (SYNTHROID, LEVOTHROID) 75 MCG tablet Take 75 mcg by mouth daily before breakfast.  . metFORMIN (GLUCOPHAGE-XR) 500 MG 24 hr tablet Take 1,000 mg by mouth 2 (two) times daily.  . nitroGLYCERIN (NITROSTAT) 0.4 MG SL tablet Place 0.4 mg under the tongue every 5 (five) minutes as needed for chest pain.  . Olmesartan-Amlodipine-HCTZ (TRIBENZOR) 20-5-12.5 MG TABS Take 1 tablet by mouth daily.  Marland Kitchen omeprazole (PRILOSEC) 40 MG capsule Take  40 mg by mouth daily.  Marland Kitchen tolterodine (DETROL LA) 4 MG 24 hr capsule Take 4 mg by mouth daily.      Allergies:   Actos [pioglitazone], Invokana [canagliflozin], and No known allergies   Social History   Tobacco Use  . Smoking status: Never Smoker  . Smokeless tobacco: Never Used  Substance Use Topics  . Alcohol use: No  . Drug use: No     Family Hx: The patient's family history includes CAD in her father; Cancer in her maternal aunt; Diabetes Mellitus II in her mother.  ROS   EKGs/Labs/Other Test Reviewed:    EKG:  EKG is  ordered today.  The ekg ordered today demonstrates normal sinus rhythm, HR 79, normal axis, non-specific ST-TW changes, QTc 440, no changes.    Recent Labs: Labs from primary care from 09/03/2018 personally reviewed/interpreted: Hgb 11.9, creatinine 1.01, K 5, ALT 5, TC 143, TG 182, HDL 35, LDL 72  Recent Lipid Panel Lab Results  Component Value Date/Time   CHOL 132 02/01/2016 08:17 AM   TRIG 149 02/01/2016 08:17 AM   HDL 39 (L) 02/01/2016 08:17 AM   CHOLHDL 3.4 02/01/2016 08:17 AM   LDLCALC 63 02/01/2016 08:17 AM    Physical Exam:    VS:  BP 118/74   Pulse 79   Ht 5\' 5"  (1.651 m)   Wt 173 lb 6.4 oz (78.7 kg)   SpO2 98%   BMI 28.86 kg/m     Wt Readings from Last 3 Encounters:  11/26/19 173 lb 6.4 oz (78.7 kg)  09/24/18 186 lb 9.6 oz (84.6 kg)  06/01/18 189 lb 12.8 oz (86.1 kg)     Constitutional:      Appearance: Healthy appearance. Not in distress.  Neck:     Thyroid: No thyromegaly.     Vascular: No carotid bruit. JVD normal.  Pulmonary:     Effort: Pulmonary effort is normal.     Breath sounds: No wheezing. No rales.  Cardiovascular:     Normal rate. Regular rhythm. Normal S1. Normal S2.     Murmurs: There is no murmur.  Edema:    Peripheral edema absent.  Abdominal:     Palpations: Abdomen is soft. There is no hepatomegaly.  Skin:    General: Skin is warm and dry.  Neurological:     Mental Status: Alert and oriented to person, place and time.     Cranial Nerves: Cranial nerves are intact.      ASSESSMENT & PLAN:    1. Shortness of breath 2. Exercise intolerance She has a hx of a normal cardiac catheterization in 2014.  Myoview in 2016 was low risk.  She has recently noted fatigue and some shortness of breath with activity that has gotten worse.  She has not specifically has chest pain.  Her ECG is unchanged.  Her symptoms may be due to low BP but we need to consider an anginal equivalent (as she is a diabetic).  Also, with her exercise intolerance, we need to rule out chronotropic incompetence.   -Arrange Exercise Myoview  -FU virtual visit 4 weeks to review results, symptoms    -Request  recent labs from her PCP (CMET, CBC, TSH)  3. Essential hypertension BP is well controlled.  She has been losing weight and her BP has been running lower.  Her PCP recently reduced her dose of olmesartan/amlodipine/HCTZ.  If her stress test is normal, I suspect her symptoms may have all been related to low BP.  4. Type 2 diabetes mellitus without complication, without long-term current use of insulin (HCC) Recent A1c 5.9.  Her diabetes seems to be well controlled.   Dispo:  Return in about 4 weeks (around 12/24/2019) for Follow up after testing, w/ Dr. Harrington Challenger, or Richardson Dopp, PA-C, via Telemedicine.   Medication Adjustments/Labs and Tests Ordered: Current medicines are reviewed at length with the patient today.  Concerns regarding medicines are outlined above.  Tests Ordered: Orders Placed This Encounter  Procedures  . MYOCARDIAL PERFUSION IMAGING  . EKG 12-Lead   Medication Changes: No orders of the defined types were placed in this encounter.   Signed, Richardson Dopp, PA-C  11/26/2019 10:08 AM    [facility] [place], [place], [place]  [address] Phone: [phone]; Fax: [phone]    Addendum 12/03/2019  Labs from PCP received/reviewed: 11/13/2019: Hgb 11.6, SCr 1.35, K 4.4, ALT 12, TC 156, TG 175, HDL 35, LDL 86, TSH 0.74  Richardson Dopp, PA-C    12/03/2019 8:26 PM    Addendum 12/04/2019  Patient declined obtaining a COVID-19 test prior to her stress test. I had recommended an exercise Myoview to help r/o chronotropic incompetence.  We will not be able to assess for chronotropic incompetence without doing an exercise test.  We will change her test to a Lexiscan.  If her Myoview is normal, we can always try to walk her in the office with a pulse ox to see if her HR increases appropriately. Richardson Dopp, PA-C    12/04/2019 3:24 PM

## 2019-11-26 ENCOUNTER — Encounter: Payer: Self-pay | Admitting: Physician Assistant

## 2019-11-26 ENCOUNTER — Ambulatory Visit: Payer: Medicare Other | Admitting: Physician Assistant

## 2019-11-26 ENCOUNTER — Other Ambulatory Visit: Payer: Self-pay

## 2019-11-26 VITALS — BP 118/74 | HR 79 | Ht 65.0 in | Wt 173.4 lb

## 2019-11-26 DIAGNOSIS — R0602 Shortness of breath: Secondary | ICD-10-CM | POA: Diagnosis not present

## 2019-11-26 DIAGNOSIS — E119 Type 2 diabetes mellitus without complications: Secondary | ICD-10-CM

## 2019-11-26 DIAGNOSIS — R6889 Other general symptoms and signs: Secondary | ICD-10-CM | POA: Diagnosis not present

## 2019-11-26 DIAGNOSIS — I10 Essential (primary) hypertension: Secondary | ICD-10-CM

## 2019-11-26 NOTE — Patient Instructions (Addendum)
Medication Instructions:  Your physician recommends that you continue on your current medications as directed. Please refer to the Current Medication list given to you today.  *If you need a refill on your cardiac medications before your next appointment, please call your pharmacy*   Lab Work: NONE If you have labs (blood work) drawn today and your tests are completely normal, you will receive your results only by: Marland Kitchen MyChart Message (if you have MyChart) OR . A paper copy in the mail If you have any lab test that is abnormal or we need to change your treatment, we will call you to review the results.   Testing/Procedures: Your physician has requested that you have en exercise stress myoview. For further information please visit [email or web address]. Please follow instruction sheet, as given. TO BE DONE ON MAY 13TH AT 11:45 AM   Follow-Up: At [facility], you and your health needs are our priority.  As part of our continuing mission to provide you with exceptional heart care, we have created designated Provider Care Teams.  These Care Teams include your primary Cardiologist (physician) and Advanced Practice Providers (APPs -  Physician Assistants and Nurse Practitioners) who all work together to provide you with the care you need, when you need it.  We recommend signing up for the patient portal called "MyChart".  Sign up information is provided on this After Visit Summary.  MyChart is used to connect with patients for Virtual Visits (Telemedicine).  Patients are able to view lab/test results, encounter notes, upcoming appointments, etc.  Non-urgent messages can be sent to your provider as well.   To learn more about what you can do with MyChart, go to [email or web address].    Your next appointment:   1 month(s)  The format for your next appointment:   Virtual Visit   Provider:   You may see Dorris Carnes, MD or Richardson Dopp, PA-C

## 2019-12-04 NOTE — Addendum Note (Signed)
Addended by: Jacinta Shoe on: 12/04/2019 12:58 PM   Modules accepted: Orders

## 2019-12-12 ENCOUNTER — Other Ambulatory Visit (HOSPITAL_COMMUNITY): Payer: Medicare Other

## 2019-12-12 ENCOUNTER — Encounter (HOSPITAL_COMMUNITY): Payer: Medicare Other

## 2019-12-25 ENCOUNTER — Telehealth: Payer: Medicare Other | Admitting: Physician Assistant

## 2020-02-18 ENCOUNTER — Other Ambulatory Visit: Payer: Self-pay

## 2020-02-18 ENCOUNTER — Other Ambulatory Visit: Payer: Self-pay | Admitting: Podiatry

## 2020-02-18 ENCOUNTER — Ambulatory Visit: Payer: Medicare Other | Admitting: Podiatry

## 2020-02-18 ENCOUNTER — Encounter: Payer: Self-pay | Admitting: Podiatry

## 2020-02-18 ENCOUNTER — Ambulatory Visit: Payer: Medicare Other | Admitting: Orthotics

## 2020-02-18 ENCOUNTER — Ambulatory Visit (INDEPENDENT_AMBULATORY_CARE_PROVIDER_SITE_OTHER): Payer: Medicare Other

## 2020-02-18 DIAGNOSIS — M722 Plantar fascial fibromatosis: Secondary | ICD-10-CM | POA: Diagnosis not present

## 2020-02-18 DIAGNOSIS — M79671 Pain in right foot: Secondary | ICD-10-CM

## 2020-02-18 DIAGNOSIS — M7730 Calcaneal spur, unspecified foot: Secondary | ICD-10-CM

## 2020-02-18 DIAGNOSIS — E119 Type 2 diabetes mellitus without complications: Secondary | ICD-10-CM

## 2020-02-18 DIAGNOSIS — G629 Polyneuropathy, unspecified: Secondary | ICD-10-CM | POA: Diagnosis not present

## 2020-02-18 DIAGNOSIS — M79672 Pain in left foot: Secondary | ICD-10-CM | POA: Diagnosis not present

## 2020-02-18 DIAGNOSIS — M779 Enthesopathy, unspecified: Secondary | ICD-10-CM

## 2020-02-18 NOTE — Progress Notes (Signed)

## 2020-02-18 NOTE — Progress Notes (Signed)
Subjective: 74 year old female presents the office with concerns of pain to both of her heels which is been ongoing for last 1 month.  Denies any recent injury or trauma denies any recent treatment.  She states that she does have a history of plantar fasciitis several years ago and it seems to come back.  No swelling. Denies any systemic complaints such as fevers, chills, nausea, vomiting. No acute changes since last appointment, and no other complaints at this time.   Reports her blood sugar this morning was around 100  Objective: AAO x3, NAD DP/PT pulses palpable bilaterally, CRT less than 3 seconds Tenderness to palpation along the plantar medial tubercle of the calcaneus at the insertion of plantar fascia on the left and right foot. There is no pain along the course of the plantar fascia within the arch of the foot. Plantar fascia appears to be intact. There is no pain with lateral compression of the calcaneus or pain with vibratory sensation. There is no pain along the course or insertion of the achilles tendon. No other areas of tenderness to bilateral lower extremities. Negative tinel sign. Hammertoes present.  No pain with calf compression, swelling, warmth, erythema  Assessment: Bilateral heel pain, plantar fasciitis  Plan: -All treatment options discussed with the patient including all alternatives, risks, complications.  -X-rays obtained reviewed.  Calcaneal spurring is present there is no evidence of acute fracture. -Plantar fascial injections performed bilaterally.  See procedure note below.  Continue anti-inflammatories as needed and stretching/icing exercises daily.  Discussed shoe modifications and orthotics.  She followed up today with Liliane Channel for new shoes. -Patient encouraged to call the office with any questions, concerns, change in symptoms.   Trula Slade DPM

## 2020-02-18 NOTE — Patient Instructions (Signed)

## 2020-03-17 ENCOUNTER — Ambulatory Visit: Payer: Medicare Other | Admitting: Podiatry

## 2020-03-25 ENCOUNTER — Ambulatory Visit (INDEPENDENT_AMBULATORY_CARE_PROVIDER_SITE_OTHER): Payer: Medicare Other | Admitting: Orthotics

## 2020-03-25 ENCOUNTER — Other Ambulatory Visit: Payer: Self-pay

## 2020-03-25 DIAGNOSIS — M7730 Calcaneal spur, unspecified foot: Secondary | ICD-10-CM

## 2020-03-25 DIAGNOSIS — G629 Polyneuropathy, unspecified: Secondary | ICD-10-CM

## 2020-03-25 DIAGNOSIS — E119 Type 2 diabetes mellitus without complications: Secondary | ICD-10-CM | POA: Diagnosis not present

## 2020-03-25 DIAGNOSIS — M722 Plantar fascial fibromatosis: Secondary | ICD-10-CM | POA: Diagnosis not present

## 2020-03-25 MED ORDER — GABAPENTIN 100 MG PO CAPS
100.0000 mg | ORAL_CAPSULE | Freq: Three times a day (TID) | ORAL | 3 refills | Status: DC
Start: 2020-03-25 — End: 2022-05-05

## 2020-03-25 NOTE — Progress Notes (Signed)

## 2020-03-25 NOTE — Addendum Note (Signed)
Addended by: Boneta Lucks on: 03/25/2020 11:11 AM   Modules accepted: Orders

## 2020-04-07 ENCOUNTER — Ambulatory Visit: Payer: Medicare Other | Admitting: Orthotics

## 2020-07-27 ENCOUNTER — Other Ambulatory Visit: Payer: Self-pay

## 2020-07-27 ENCOUNTER — Ambulatory Visit: Payer: Medicare Other | Admitting: Podiatry

## 2020-07-27 DIAGNOSIS — M722 Plantar fascial fibromatosis: Secondary | ICD-10-CM | POA: Diagnosis not present

## 2020-07-27 MED ORDER — TRIAMCINOLONE ACETONIDE 10 MG/ML IJ SUSP
10.0000 mg | Freq: Once | INTRAMUSCULAR | Status: AC
  Administered 2020-07-27: 10 mg

## 2020-07-27 NOTE — Patient Instructions (Signed)

## 2020-07-27 NOTE — Progress Notes (Signed)
Subjective: 74 year old female presents the office today for concerns of recurrent left heel pain.  She states that she was doing well however the last couple weeks the pain started to come back the bottom of the heel.  She did get some over-the-counter inserts but she just purchased them.  No recent injury or falls or any swelling in.  No other concerns today. Denies any systemic complaints such as fevers, chills, nausea, vomiting. No acute changes since last appointment, and no other complaints at this time.   Objective: AAO x3, NAD DP/PT pulses palpable bilaterally, CRT less than 3 seconds Tenderness to palpation along the plantar medial tubercle of the calcaneus at the insertion of plantar fascia on the left foot. There is no pain along the course of the plantar fascia within the arch of the foot. Plantar fascia appears to be intact. There is no pain with lateral compression of the calcaneus or pain with vibratory sensation. There is no pain along the course or insertion of the achilles tendon. No other areas of tenderness to bilateral lower extremities. No pain with calf compression, swelling, warmth, erythema  Assessment: Left heel pain, reoccurrence of plantar fascial  Plan: -All treatment options discussed with the patient including all alternatives, risks, complications.  -Steroid injection performed.  See procedure note below.  Discussed stretching, icing daily.  Continue supportive shoes and inserts as well. -Patient encouraged to call the office with any questions, concerns, change in symptoms.    Procedure: Injection Tendon/Ligament Discussed alternatives, risks, complications and verbal consent was obtained.  Location: LEFT plantar fascia at the glabrous junction; medial approach. Skin Prep: Alcohol  Injectate: 0.5cc 0.5% marcaine plain, 0.5 cc 2% lidocaine plain and, 1 cc kenalog 10. Disposition: Patient tolerated procedure well. Injection site dressed with a band-aid.   Post-injection care was discussed and return precautions discussed.   Return if symptoms worsen or fail to improve.  Vivi Barrack DPM

## 2020-08-03 ENCOUNTER — Telehealth (INDEPENDENT_AMBULATORY_CARE_PROVIDER_SITE_OTHER): Payer: Medicare Other | Admitting: Podiatry

## 2020-08-03 ENCOUNTER — Telehealth: Payer: Self-pay | Admitting: Podiatry

## 2020-08-03 ENCOUNTER — Telehealth: Payer: Self-pay | Admitting: *Deleted

## 2020-08-03 NOTE — Telephone Encounter (Signed)
Patient called and left a message early this morning and I called the patient back and patient stated that the injection that Dr Ardelle Anton gave patient last Monday is not working and patient wanted to know what type of injection it was and I stated to the patient that it was a cortisone injection and that sometimes it may take longer to work, it just depends and to keep icing and elevating and keep using the inserts and patient wanted to speak with Dr Ardelle Anton and I stated that I would relay the message to him and have Dr Ardelle Anton call the patient. Misty Stanley

## 2020-08-03 NOTE — Telephone Encounter (Signed)
I called the patient back. She states the injection did not help. We discussed doing another one if needed next week. Encouraged to continue stretching/icing and wearing good, supportive shoes.

## 2020-08-03 NOTE — Telephone Encounter (Signed)
Patient would like Dr. Gabriel Rung Nurse call her back at [phone].

## 2020-08-03 NOTE — Telephone Encounter (Signed)
Patient wants to speak with you, still having a lot of pain in her heal, wants to speak with you about injection given.

## 2020-08-31 ENCOUNTER — Ambulatory Visit: Payer: Medicare Other | Admitting: Orthotics

## 2020-08-31 ENCOUNTER — Other Ambulatory Visit: Payer: Self-pay

## 2020-08-31 ENCOUNTER — Ambulatory Visit: Payer: Medicare Other | Admitting: Podiatry

## 2020-08-31 DIAGNOSIS — M7730 Calcaneal spur, unspecified foot: Secondary | ICD-10-CM

## 2020-08-31 DIAGNOSIS — E119 Type 2 diabetes mellitus without complications: Secondary | ICD-10-CM

## 2020-08-31 DIAGNOSIS — M722 Plantar fascial fibromatosis: Secondary | ICD-10-CM

## 2020-08-31 DIAGNOSIS — M2041 Other hammer toe(s) (acquired), right foot: Secondary | ICD-10-CM

## 2020-08-31 DIAGNOSIS — M2042 Other hammer toe(s) (acquired), left foot: Secondary | ICD-10-CM

## 2020-08-31 DIAGNOSIS — G629 Polyneuropathy, unspecified: Secondary | ICD-10-CM

## 2020-08-31 MED ORDER — DEXAMETHASONE SODIUM PHOSPHATE 120 MG/30ML IJ SOLN
4.0000 mg | Freq: Once | INTRAMUSCULAR | Status: AC
  Administered 2020-08-31: 4 mg via INTRA_ARTICULAR

## 2020-08-31 MED ORDER — TRIAMCINOLONE ACETONIDE 10 MG/ML IJ SUSP
10.0000 mg | Freq: Once | INTRAMUSCULAR | Status: AC
  Administered 2020-08-31: 10 mg

## 2020-08-31 NOTE — Patient Instructions (Signed)

## 2020-08-31 NOTE — Progress Notes (Signed)
Subjective: 75 year old female presents the office today for concerns of recurrent left heel pain.  States that she has some discomfort she is request another steroid injection today.  No recent injury or fall since I last saw her no acute changes.  Also she presents today for measurement of inserts/shoes.   Objective: AAO x3, NAD DP/PT pulses palpable bilaterally, CRT less than 3 seconds Tenderness to palpation along the plantar medial tubercle of the calcaneus at the insertion of plantar fascia on the left foot. There is no pain along the course of the plantar fascia within the arch of the foot. Plantar fascia appears to be intact. There is no pain with lateral compression of the calcaneus or pain with vibratory sensation. There is no pain along the course or insertion of the achilles tendon. No other areas of tenderness to bilateral lower extremities. No pain with calf compression, swelling, warmth, erythema  Assessment: Left heel pain, reoccurrence of plantar fascial  Plan: -All treatment options discussed with the patient including all alternatives, risks, complications.  -Steroid injection performed.  See procedure note below.  Discussed stretching, icing daily.  Continue supportive shoes and inserts as well. Discussed that combining the injection with the stretching, shoe changes will be more effective than just a steroid injection alone.  -Patient encouraged to call the office with any questions, concerns, change in symptoms.    Procedure: Injection Tendon/Ligament Discussed alternatives, risks, complications and verbal consent was obtained.  Location: LEFT plantar fascia at the glabrous junction; medial approach. Skin Prep: Alcohol  Injectate: 0.5cc 0.5% marcaine plain, 0.5 cc 2% lidocaine plain and, 0.5 cc kenalog 10, 0.5 cc dexamethasone phosphate. Disposition: Patient tolerated procedure well. Injection site dressed with a band-aid.  Post-injection care was discussed and return  precautions discussed.   Trula Slade DPM

## 2020-08-31 NOTE — Progress Notes (Signed)

## 2020-09-28 ENCOUNTER — Other Ambulatory Visit: Payer: Self-pay

## 2020-09-28 ENCOUNTER — Ambulatory Visit (INDEPENDENT_AMBULATORY_CARE_PROVIDER_SITE_OTHER): Payer: Medicare Other | Admitting: Podiatrist

## 2020-09-28 DIAGNOSIS — M2141 Flat foot [pes planus] (acquired), right foot: Secondary | ICD-10-CM

## 2020-09-28 DIAGNOSIS — M2041 Other hammer toe(s) (acquired), right foot: Secondary | ICD-10-CM

## 2020-09-28 DIAGNOSIS — E1140 Type 2 diabetes mellitus with diabetic neuropathy, unspecified: Secondary | ICD-10-CM

## 2020-09-28 DIAGNOSIS — E119 Type 2 diabetes mellitus without complications: Secondary | ICD-10-CM

## 2020-09-28 DIAGNOSIS — M216X1 Other acquired deformities of right foot: Secondary | ICD-10-CM

## 2020-09-28 DIAGNOSIS — M216X2 Other acquired deformities of left foot: Secondary | ICD-10-CM

## 2020-09-28 DIAGNOSIS — M2042 Other hammer toe(s) (acquired), left foot: Secondary | ICD-10-CM

## 2020-09-28 DIAGNOSIS — G629 Polyneuropathy, unspecified: Secondary | ICD-10-CM

## 2020-09-28 DIAGNOSIS — M722 Plantar fascial fibromatosis: Secondary | ICD-10-CM

## 2020-09-28 DIAGNOSIS — M2142 Flat foot [pes planus] (acquired), left foot: Secondary | ICD-10-CM

## 2020-09-28 NOTE — Progress Notes (Signed)
The patient presented to the office to day to pick up diabetic shoes and 3 pr diabetic custom inserts.  1 pr of  inserts were put in the shoes and the shoes were fitted to the patient.  The patient states they are comfortable and free of defect. She was satisfied with the fit of the shoe.  Instructions for break in and wear were dispensed.  The patient signed the delivery documentation and break in instruction form.    She will try these to see how do they for her plantar fasciitis-  She will discuss with Dr. Jacqualyn Posey if she may need a more supportive device in the future.   If any questions or concerns arise, she is instructed to call.  Otherwise she will be seen back for her next scheduled appointment.

## 2020-12-08 ENCOUNTER — Institutional Professional Consult (permissible substitution): Payer: Medicare Other | Admitting: Pulmonary Disease

## 2020-12-25 ENCOUNTER — Institutional Professional Consult (permissible substitution): Payer: Medicare Other | Admitting: Pulmonary Disease

## 2020-12-31 ENCOUNTER — Other Ambulatory Visit: Payer: Self-pay

## 2020-12-31 ENCOUNTER — Ambulatory Visit: Payer: Medicare Other | Admitting: Podiatry

## 2020-12-31 DIAGNOSIS — M722 Plantar fascial fibromatosis: Secondary | ICD-10-CM

## 2020-12-31 MED ORDER — TRIAMCINOLONE ACETONIDE 40 MG/ML IJ SUSP: 20.0000 mg | Freq: Once | INTRAMUSCULAR | Status: AC

## 2020-12-31 NOTE — Patient Instructions (Signed)

## 2021-01-05 DIAGNOSIS — M722 Plantar fascial fibromatosis: Secondary | ICD-10-CM | POA: Insufficient documentation

## 2021-01-05 NOTE — Progress Notes (Signed)
Subjective: 75 year old female presents the office today requesting steroid injection of the heel.  She states that the pain is starting to come back along the heel.  She also has met other treatments that can be done to help with this.  Denies recent near fall changes otherwise. Denies any systemic complaints such as fevers, chills, nausea, vomiting. No acute changes since last appointment, and no other complaints at this time.   Objective: AAO x3, NAD DP/PT pulses palpable bilaterally, CRT less than 3 seconds Tenderness palpation on the plantar medial tubercle of the calcaneus at the insertion of plantar fascia the left side.  The plantar fascial appears intact.  There is no pain with lateral compression of calcaneus.  No edema, erythema.  MMT 5/5.  No pain with calf compression, swelling, warmth, erythema  Assessment: Left foot plantar fasciitis  Plan: -All treatment options discussed with the patient including all alternatives, risks, complications.  -Steroid injection was performed today.  See procedure note below.  Dispensed on a partial brace.  Discussed traction, icing daily.  Discussed supportive shoes and good arch support -Patient encouraged to call the office with any questions, concerns, change in symptoms.   Procedure: Injection Tendon/Ligament Discussed alternatives, risks, complications and verbal consent was obtained.  Location: Left plantar fascia at the glabrous junction; medial approach. Skin Prep: Alcohol. Injectate: 0.5cc 0.5% marcaine plain, 0.5 cc 2% lidocaine plain and, 0.5 cc kenalog 40. Disposition: Patient tolerated procedure well. Injection site dressed with a band-aid.  Post-injection care was discussed and return precautions discussed.   Trula Slade DPM

## 2022-05-05 ENCOUNTER — Ambulatory Visit: Payer: Medicare Other | Attending: Physician Assistant | Admitting: Physician Assistant

## 2022-05-05 ENCOUNTER — Encounter: Payer: Self-pay | Admitting: Physician Assistant

## 2022-05-05 VITALS — BP 160/88 | HR 50 | Ht 64.0 in | Wt 179.0 lb

## 2022-05-05 DIAGNOSIS — R0609 Other forms of dyspnea: Secondary | ICD-10-CM

## 2022-05-05 DIAGNOSIS — R5383 Other fatigue: Secondary | ICD-10-CM | POA: Diagnosis not present

## 2022-05-05 DIAGNOSIS — R0789 Other chest pain: Secondary | ICD-10-CM | POA: Diagnosis not present

## 2022-05-05 DIAGNOSIS — Z79899 Other long term (current) drug therapy: Secondary | ICD-10-CM

## 2022-05-05 DIAGNOSIS — I10 Essential (primary) hypertension: Secondary | ICD-10-CM

## 2022-05-05 MED ORDER — HYDRALAZINE HCL 25 MG PO TABS: 25.0000 mg | ORAL_TABLET | Freq: Three times a day (TID) | ORAL | 3 refills | Status: DC

## 2022-05-05 MED ORDER — HYDRALAZINE HCL 25 MG PO TABS: 25.0000 mg | ORAL_TABLET | Freq: Three times a day (TID) | ORAL | 3 refills | Status: AC

## 2022-05-05 MED ORDER — AMLODIPINE BESYLATE 5 MG PO TABS: 5.0000 mg | ORAL_TABLET | Freq: Every day | ORAL | 3 refills | Status: AC

## 2022-05-05 MED ORDER — AMLODIPINE BESYLATE 5 MG PO TABS: 5.0000 mg | ORAL_TABLET | Freq: Every day | ORAL | 3 refills | Status: DC

## 2022-05-05 NOTE — Progress Notes (Signed)
Cardiology Office Note:    Date:  05/05/2022   ID:  Katrina Swanson, DOB 1945/08/17, MRN [id number]  PCP:  No primary care provider on file.  [facility] Cardiologist:  Dorris Carnes, MD  [facility] Electrophysiologist:  None   Chief Complaint: BP   History of Present Illness:    Katrina Swanson is a 76 y.o. female with a hx of HTN, HLD, DM, CVA, OSA and hypothyroidism seen for follow up.   Cardiac catheterization in 2014: no CAD Echocardiogram 2015: normal EF Myoview 2016: low risk, no ischemia  Last seen 10/2019.  The patient is admitted for acute visit for blood pressure issue.  She lives in [place], about 1.5 hours away from [place].  For past 71-month her systolic blood pressure running in 160 to 170s.  She lost her son in August.  She is still grieving.  Since past 3 weeks she has noted diastolic blood pressure in 100s.  Working with PCP but blood pressure remains elevated.  Also seen in emergency room twice but no improvement.  Reports some kidney issue but did not remember kidney function.  CT of head was negative.  No records available for review.  Initially her systolic blood pressure was above 180, which improved to 160 during visit.  She is also reporting dyspnea on exertion and chest pressure.  This been ongoing for past few weeks.  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  Denies orthopnea, PND, syncope or lower extremity edema.  Her amlodipine was discontinued by PCP due to lower extremity edema on 10 mg dose.  She had no issue on lower dose.   Past Medical History:  Diagnosis Date   Asthma    Cardiac catheterization    LHC in 9/14:  no sig CAD (min irregs in RCA), EF 55-65   Carotid Doppler ultrasound    Carotid [place]:  Bilateral ICA 1-39   Diabetes mellitus without complication ([place]    Echocardiogram    Echo 6/15: EF 55-60, normal wall motion, grade 1 diastolic dysfunction   Hyperlipidemia    Hypertension    Hypothyroidism    hypothyroid    Nuclear stress test    Nuc study 9/16: no ischemia, EF 69; low risk   Stroke ([facility]    2012    Past Surgical History:  Procedure Laterality Date   APPENDECTOMY     CARDIAC CATHETERIZATION     CARPAL TUNNEL RELEASE     cataracts     LEFT HEART CATHETERIZATION WITH CORONARY ANGIOGRAM N/A 04/29/2013   Procedure: LEFT HEART CATHETERIZATION WITH CORONARY ANGIOGRAM;  Surgeon: Peter M JMartinique MD;  [facility];  Service: Cardiovascular;  Laterality: N/A;   LUMBAR LAMINECTOMY/DECOMPRESSION MICRODISCECTOMY N/A 01/12/2017   Procedure: LAMINECTOMY AND FORAMINOTOMY LUMBAR 4- LUMBAR 5;  Surgeon: CAshok Pall MD;  [place]  Service: Neurosurgery;  Laterality: N/A;  LAMINECTOMY AND FORAMINOTOMY LUMBAR 4- LUMBAR 5    Current Medications: Current Meds  Medication Sig   albuterol (PROVENTIL HFA;VENTOLIN HFA) 108 (90 BASE) MCG/ACT inhaler Inhale 2 puffs into the lungs every 4 (four) hours as needed for wheezing or shortness of breath.   atorvastatin (LIPITOR) 80 MG tablet Take 80 mg by mouth at bedtime.    carvedilol (COREG) 3.125 MG tablet Take 3.125 mg by mouth 2 (two) times daily with a meal.   donepezil (ARICEPT) 5 MG tablet Take 5 mg by mouth at bedtime.   hydrochlorothiazide (HYDRODIURIL) 12.5 MG tablet Take 12.5 mg by mouth  daily.   hydrocortisone 2.5 % cream Apply topically daily.   levothyroxine (SYNTHROID) 50 MCG tablet Take 50 mcg by mouth daily before breakfast.   magnesium gluconate (MAGONATE) 500 MG tablet Take 500 mg by mouth daily.   meclizine (ANTIVERT) 25 MG tablet Take by mouth.   nitroGLYCERIN (NITROSTAT) 0.4 MG SL tablet Place 0.4 mg under the tongue every 5 (five) minutes as needed for chest pain.   olmesartan (BENICAR) 40 MG tablet Take 40 mg by mouth daily.   omeprazole (PRILOSEC) 40 MG capsule Take 40 mg by mouth daily.   oxybutynin (DITROPAN-XL) 10 MG 24 hr tablet Take 10 mg by mouth daily.   [DISCONTINUED] amLODipine (NORVASC) 5 MG tablet Take 1 tablet (5 mg  total) by mouth daily.   [DISCONTINUED] hydrALAZINE (APRESOLINE) 10 MG tablet Take 10 mg by mouth 2 (two) times daily.   Current Facility-Administered Medications for the 05/05/22 encounter (Office Visit) with Leanor Kail, PA  Medication   triamcinolone acetonide (KENALOG-40) injection 20 mg     Allergies:   Actos [pioglitazone], Invokana [canagliflozin], and No known allergies   Social History   Socioeconomic History   Marital status: Married    Spouse name: sammie   Number of children: 3   Years of education: 12th   Highest education level: Not on file  Occupational History   Occupation: retired  Tobacco Use   Smoking status: Never   Smokeless tobacco: Never  Substance and Sexual Activity   Alcohol use: No   Drug use: No   Sexual activity: Yes    Partners: Male  Other Topics Concern   Not on file  Social History Narrative   Patient lives with her husband.   Patient is right handed   Patient drinks sodas   Social Determinants of Health   Financial Resource Strain: Not on file  Food Insecurity: Not on file  Transportation Needs: Not on file  Physical Activity: Not on file  Stress: Not on file  Social Connections: Not on file     Family History: The patient's family history includes CAD in her father; Cancer in her maternal aunt; Diabetes Mellitus II in her mother.    ROS:   Please see the history of present illness.    All other systems reviewed and are negative.   EKGs/Labs/Other Studies Reviewed:    The following studies were reviewed today:  Stress test 04/2015 The left ventricular ejection fraction is hyperdynamic (>65%). Nuclear stress EF: 69%. There was no ST segment deviation noted during stress. No T wave inversion was noted during stress. This is a low risk study.   There is no significant reversible ischemia (there is actually reverse redistribution - rest images are worse than stress images). LVEF 69% with normal wall motion. PVC's and a  period of ventricular bigeminy were noted during the study. This is a low risk study.   Echo 12/2013 Study Conclusions   - Left ventricle: The cavity size was normal. Wall thickness was    normal. Systolic function was normal. The estimated ejection    fraction was in the range of 55% to 60%. Wall motion was normal;    there were no regional wall motion abnormalities. Doppler    parameters are consistent with abnormal left ventricular    relaxation (grade 1 diastolic dysfunction).  - Atrial septum: No defect or patent foramen ovale was identified.   Impressions:   - No cardiac source of emboli was indentified.   EKG:  EKG is  ordered today.  The ekg ordered today demonstrates sinus rhythm with T wave inversion in inferior lateral leads  Recent Labs: No results found for requested labs within last 365 days.  Recent Lipid Panel    Component Value Date/Time   CHOL 132 02/01/2016 0817   TRIG 149 02/01/2016 0817   HDL 39 (L) 02/01/2016 0817   CHOLHDL 3.4 02/01/2016 0817   VLDL 30 02/01/2016 0817   LDLCALC 63 02/01/2016 0817     Physical Exam:    VS:  BP (!) 160/88   Pulse (!) 50   Ht '5\' 4"'$  (1.626 m)   Wt 179 lb (81.2 kg)   SpO2 96%   BMI 30.73 kg/m     Wt Readings from Last 3 Encounters:  05/05/22 179 lb (81.2 kg)  11/26/19 173 lb 6.4 oz (78.7 kg)  09/24/18 186 lb 9.6 oz (84.6 kg)     GEN:  Well nourished, well developed in no acute distress HEENT: Normal NECK: No JVD; No carotid bruits LYMPHATICS: No lymphadenopathy CARDIAC: RRR, no murmurs, rubs, gallops RESPIRATORY:  Clear to auscultation without rales, wheezing or rhonchi  ABDOMEN: Soft, non-tender, non-distended MUSCULOSKELETAL:  No edema; No deformity  SKIN: Warm and dry NEUROLOGIC:  Alert and oriented x 3 PSYCHIATRIC:  Normal affect   ASSESSMENT AND PLAN:    Chest pain/shortness of breath She is reporting exertional chest pressure and shortness of breath for the past few weeks.  She is also grieving  from losing son in August.  Her symptoms could be due to uncontrolled blood pressure, hypertensive cardiomyopathy versus Takotsubo cardiomyopathy.  We will get echocardiogram.  Coronary CTA versus stress test based on renal function.  I have already consented her.  Her heart rate already in 50s so would not need higher dose of carvedilol.  2.  Hypertension, uncontrolled  -Improved during visit.  Could be due to underlying stress/anxiety but symptoms concerning. -Continue carvedilol 3.25 mg twice daily, hydrochlorothiazide 12.5 mg daily and Benicar 40 mg daily -Increase hydralazine to 25 mg 3 times daily -Add amlodipine 5 mg daily (will not titrate further due to edema on higher dose)   PlanMedication Adjustments/Labs and Tests Ordered: Current medicines are reviewed at length with the patient today.  Concerns regarding medicines are outlined above.  Orders Placed This Encounter  Procedures   TSH   Basic Metabolic Panel (BMET)   EKG 12-Lead   ECHOCARDIOGRAM COMPLETE   Meds ordered this encounter  Medications   DISCONTD: amLODipine (NORVASC) 5 MG tablet    Sig: Take 1 tablet (5 mg total) by mouth daily.    Dispense:  30 tablet    Refill:  3   DISCONTD: hydrALAZINE (APRESOLINE) 25 MG tablet    Sig: Take 1 tablet (25 mg total) by mouth 3 (three) times daily.    Dispense:  90 tablet    Refill:  3   amLODipine (NORVASC) 5 MG tablet    Sig: Take 1 tablet (5 mg total) by mouth daily.    Dispense:  30 tablet    Refill:  3   hydrALAZINE (APRESOLINE) 25 MG tablet    Sig: Take 1 tablet (25 mg total) by mouth 3 (three) times daily.    Dispense:  90 tablet    Refill:  3    Patient Instructions  Medication Instructions:  START Norvasc '5mg'$  Take 1 tablet daily  INCREASE Hydralazine to '25mg'$  Take 1 tablet 3 times a day  *If you need a refill on your cardiac medications  before your next appointment, please call your pharmacy*   Lab Work: TODAY-TSH, BMET If you have labs (blood work) drawn  today and your tests are completely normal, you will receive your results only by: [place] (if you have MyChart) OR A paper copy in the mail If you have any lab test that is abnormal or we need to change your treatment, we will call you to review the results.   Testing/Procedures: Your physician has requested that you have an echocardiogram. Echocardiography is a painless test that uses sound waves to create images of your heart. It provides your doctor with information about the size and shape of your heart and how well your heart's chambers and valves are working. This procedure takes approximately one hour. There are no restrictions for this procedure.   Follow-Up: At [facility], you and your health needs are our priority.  As part of our continuing mission to provide you with exceptional heart care, we have created designated Provider Care Teams.  These Care Teams include your primary Cardiologist (physician) and Advanced Practice Providers (APPs -  Physician Assistants and Nurse Practitioners) who all work together to provide you with the care you need, when you need it.  We recommend signing up for the patient portal called "MyChart".  Sign up information is provided on this After Visit Summary.  MyChart is used to connect with patients for Virtual Visits (Telemedicine).  Patients are able to view lab/test results, encounter notes, upcoming appointments, etc.  Non-urgent messages can be sent to your provider as well.   To learn more about what you can do with MyChart, go to [email or web address].    Your next appointment:   4-6 week(s)  The format for your next appointment:   In Person  Provider:   Robbie Lis, PA-C       Other Instructions   Important Information About Sugar         Jarrett Soho, PA  05/05/2022 3:13 PM    [place]

## 2022-05-05 NOTE — Patient Instructions (Addendum)
Medication Instructions:  START Norvasc '5mg'$  Take 1 tablet daily  INCREASE Hydralazine to '25mg'$  Take 1 tablet 3 times a day  *If you need a refill on your cardiac medications before your next appointment, please call your pharmacy*   Lab Work: TODAY-TSH, BMET If you have labs (blood work) drawn today and your tests are completely normal, you will receive your results only by: [place] (if you have MyChart) OR A paper copy in the mail If you have any lab test that is abnormal or we need to change your treatment, we will call you to review the results.   Testing/Procedures: Your physician has requested that you have an echocardiogram. Echocardiography is a painless test that uses sound waves to create images of your heart. It provides your doctor with information about the size and shape of your heart and how well your heart's chambers and valves are working. This procedure takes approximately one hour. There are no restrictions for this procedure.   Follow-Up: At [facility], you and your health needs are our priority.  As part of our continuing mission to provide you with exceptional heart care, we have created designated Provider Care Teams.  These Care Teams include your primary Cardiologist (physician) and Advanced Practice Providers (APPs -  Physician Assistants and Nurse Practitioners) who all work together to provide you with the care you need, when you need it.  We recommend signing up for the patient portal called "MyChart".  Sign up information is provided on this After Visit Summary.  MyChart is used to connect with patients for Virtual Visits (Telemedicine).  Patients are able to view lab/test results, encounter notes, upcoming appointments, etc.  Non-urgent messages can be sent to your provider as well.   To learn more about what you can do with MyChart, go to [email or web address].    Your next appointment:   4-6 week(s)  The format for your next appointment:    In Person  Provider:   Robbie Lis, PA-C       Other Instructions   Important Information About Sugar

## 2022-05-06 LAB — BASIC METABOLIC PANEL
BUN/Creatinine Ratio: 16 (ref 12–28)
BUN: 18 mg/dL (ref 8–27)
CO2: 25 mmol/L (ref 20–29)
Calcium: 9 mg/dL (ref 8.7–10.3)
Chloride: 104 mmol/L (ref 96–106)
Creatinine, Ser: 1.16 mg/dL — ABNORMAL HIGH (ref 0.57–1.00)
Glucose: 89 mg/dL (ref 70–99)
Potassium: 4.2 mmol/L (ref 3.5–5.2)
Sodium: 142 mmol/L (ref 134–144)
eGFR: 48 mL/min/{1.73_m2} — ABNORMAL LOW (ref >59)

## 2022-05-06 LAB — TSH: TSH: 1.43 u[IU]/mL (ref 0.450–4.500)

## 2022-05-09 ENCOUNTER — Telehealth: Payer: Self-pay

## 2022-05-09 DIAGNOSIS — R0789 Other chest pain: Secondary | ICD-10-CM

## 2022-05-09 DIAGNOSIS — I200 Unstable angina: Secondary | ICD-10-CM

## 2022-05-09 DIAGNOSIS — R0609 Other forms of dyspnea: Secondary | ICD-10-CM

## 2022-05-09 MED ORDER — METOPROLOL TARTRATE 50 MG PO TABS: 50.0000 mg | ORAL_TABLET | Freq: Once | ORAL | 0 refills | Status: DC

## 2022-05-09 NOTE — Telephone Encounter (Signed)
INSTRUCTIONS FOR CORONARY CTA Please arrive at the [facility] main entrance of [facility] at (30-45 minutes prior to test start time)  [facility] [place], [place] [address] [phone]  Proceed to the [facility] Radiology Department (First Floor).  Please follow these instructions carefully (unless otherwise directed): PLEASE HAVE LABS - BMP  AT LEAST ONE WEEK PRIOR TO TEST  On the Night Before the Test: Drink plenty of water. Do not consume any caffeinated/decaffeinated beverages or chocolate 12 hours prior to your test. Do not take any antihistamines 12 hours prior to your test.  On the Day of the Test: Drink plenty of water. Do not drink any water within one hour of the test. Do not eat any food 4 hours prior to the test. You may take your regular medications prior to the test. Take 50 mg of Lopressor (Metoprolol) one hour before the test.  After the Test: Drink plenty of water. After receiving IV contrast, you may experience a mild flushed feeling. This is normal. On occasion, you may experience a mild rash up to 24 hours after the test. This is not dangerous. If this occurs, you can take Benadryl 25 mg and increase your fluid intake. If you experience trouble breathing, this can be serious. If it is severe call 911 IMMEDIATELY. If it is mild, please call our office.

## 2022-05-09 NOTE — Telephone Encounter (Signed)
Patient notified directly and voiced understanding.   Copy of instructions will be mailed to the patient per her request.

## 2022-05-20 ENCOUNTER — Ambulatory Visit (HOSPITAL_COMMUNITY): Payer: Medicare Other | Attending: Physician Assistant

## 2022-05-20 DIAGNOSIS — R0609 Other forms of dyspnea: Secondary | ICD-10-CM | POA: Insufficient documentation

## 2022-05-20 DIAGNOSIS — R0789 Other chest pain: Secondary | ICD-10-CM | POA: Insufficient documentation

## 2022-05-20 DIAGNOSIS — R5383 Other fatigue: Secondary | ICD-10-CM | POA: Diagnosis not present

## 2022-05-20 DIAGNOSIS — Z79899 Other long term (current) drug therapy: Secondary | ICD-10-CM | POA: Diagnosis not present

## 2022-05-20 LAB — ECHOCARDIOGRAM COMPLETE
Area-P 1/2: 3.12 cm2
S' Lateral: 2.9 cm

## 2022-05-24 ENCOUNTER — Ambulatory Visit (HOSPITAL_COMMUNITY): Admission: RE | Admit: 2022-05-24 | Payer: Medicare Other | Source: Ambulatory Visit

## 2022-06-02 ENCOUNTER — Telehealth (HOSPITAL_COMMUNITY): Payer: Self-pay | Admitting: *Deleted

## 2022-06-02 NOTE — Telephone Encounter (Signed)
Reaching out to patient to offer assistance regarding upcoming cardiac imaging study; pt verbalizes understanding of appt date/time, parking situation and where to check in, and verified current allergies; name and call back number provided for further questions should they arise  Gordy Clement RN Navigator Cardiac Imaging [facility] [phone] office [phone] cell  Patient aware to arrive at [place].

## 2022-06-03 ENCOUNTER — Ambulatory Visit (HOSPITAL_BASED_OUTPATIENT_CLINIC_OR_DEPARTMENT_OTHER)
Admission: RE | Admit: 2022-06-03 | Discharge: 2022-06-03 | Disposition: A | Discharge status: Home / Self Care | Payer: Medicare Other | Source: Ambulatory Visit | Attending: Cardiology | Admitting: Cardiology

## 2022-06-03 ENCOUNTER — Ambulatory Visit (HOSPITAL_COMMUNITY)
Admission: RE | Admit: 2022-06-03 | Discharge: 2022-06-03 | Disposition: A | Discharge status: Home / Self Care | Payer: Medicare Other | Source: Ambulatory Visit | Attending: Physician Assistant | Admitting: Physician Assistant

## 2022-06-03 ENCOUNTER — Other Ambulatory Visit: Payer: Self-pay | Admitting: Cardiology

## 2022-06-03 DIAGNOSIS — R931 Abnormal findings on diagnostic imaging of heart and coronary circulation: Secondary | ICD-10-CM | POA: Insufficient documentation

## 2022-06-03 DIAGNOSIS — I25110 Atherosclerotic heart disease of native coronary artery with unstable angina pectoris: Secondary | ICD-10-CM | POA: Diagnosis not present

## 2022-06-03 DIAGNOSIS — I200 Unstable angina: Secondary | ICD-10-CM | POA: Diagnosis present

## 2022-06-03 MED ORDER — NITROGLYCERIN 0.4 MG SL SUBL
SUBLINGUAL_TABLET | SUBLINGUAL | Status: AC
  Filled 2022-06-03: qty 2

## 2022-06-03 MED ORDER — IOHEXOL 350 MG/ML SOLN
100.0000 mL | Freq: Once | INTRAVENOUS | Status: AC | PRN
  Administered 2022-06-03: 100 mL via INTRAVENOUS

## 2022-06-03 MED ORDER — NITROGLYCERIN 0.4 MG SL SUBL
0.8000 mg | SUBLINGUAL_TABLET | Freq: Once | SUBLINGUAL | Status: AC
  Administered 2022-06-03: 0.8 mg via SUBLINGUAL

## 2022-06-07 ENCOUNTER — Telehealth: Payer: Self-pay | Admitting: Internal Medicine

## 2022-06-07 NOTE — Telephone Encounter (Signed)
Pt is returning call. Requesting return call.  

## 2022-06-07 NOTE — Telephone Encounter (Signed)
Spoke with the patient and gave her test results. She voiced understanding.

## 2022-06-08 ENCOUNTER — Ambulatory Visit: Payer: Medicare Other | Admitting: Physician Assistant

## 2023-04-21 ENCOUNTER — Ambulatory Visit: Payer: Medicare Other | Admitting: Obstetrics and Gynecology

## 2023-05-03 ENCOUNTER — Encounter: Payer: Self-pay | Admitting: Obstetrics and Gynecology

## 2023-05-03 ENCOUNTER — Ambulatory Visit: Payer: Medicare Other | Admitting: Obstetrics and Gynecology

## 2023-05-03 VITALS — BP 137/77 | HR 65 | Ht 62.6 in | Wt 176.0 lb

## 2023-05-03 DIAGNOSIS — N3281 Overactive bladder: Secondary | ICD-10-CM

## 2023-05-03 DIAGNOSIS — N8110 Cystocele, unspecified: Secondary | ICD-10-CM

## 2023-05-03 DIAGNOSIS — N816 Rectocele: Secondary | ICD-10-CM

## 2023-05-03 DIAGNOSIS — R159 Full incontinence of feces: Secondary | ICD-10-CM

## 2023-05-03 NOTE — Progress Notes (Signed)
New Patient Evaluation and Consultation  Referring Provider: No ref. provider found PCP: Pcp, No Date of Service: 05/03/2023  SUBJECTIVE Chief Complaint: New Patient (Initial Visit) (Katrina Swanson is a 77 y.o. female here for a consult for prolapse and incontinence.)  History of Present Illness: Katrina Swanson is a 77 y.o. Black or African-American female presenting for evaluation of prolapse.    Urinary Symptoms: Leaks urine with going from sitting to standing, with movement to the bathroom, and with urgency Leaks every time she gets up to go to the bathroom. Pad use:  pads  Patient is bothered by UI symptoms. Taking darifenacin 7.5mg , she does feel like it helps some. She was taking oxybutynin for many years up until 5-6 months ago. Records reveal use of oxytrol patch in 2013 from [facility], but patient does not recall this.   Day time voids 4-5.  Nocturia: 5 times per night to void. Voiding dysfunction:  empties bladder well.  Patient does not use a catheter to empty bladder.  When urinating, patient feels a weak stream and dribbling after finishing Drinks: 1 cup coffee, 5 glasses water per day, green tea or juice or 1 soda a week  UTIs: 1 UTI's in the last year.   Denies history of blood in urine and kidney or bladder stones   Pelvic Organ Prolapse Symptoms:                  Patient Admits to a feeling of a bulge the vaginal area. It has been present for a few years. Patient Admits to seeing a bulge.  This bulge is not bothersome.  Bowel Symptom: Bowel movements: every 3-4 days- needs to take lactulose to have a BM Stool consistency: loose Straining: yes.  Splinting: no.  Incomplete evacuation: no.  Patient Admits to accidental bowel leakage / fecal incontinence  - when stools are loose Bowel regimen: lactulose every 2-3 days. Has tried miralax and stool softeners   Sexual Function Sexually active: no.    Pelvic Pain Denies pelvic pain   Past Medical History:   Past Medical History:  Diagnosis Date   Asthma    Cardiac catheterization    LHC in 9/14:  no sig CAD (min irregs in RCA), EF 55-65   Carotid Doppler ultrasound    Carotid US 6/15:  Bilateral ICA 1-39   Diabetes mellitus without complication (HCC)    Echocardiogram    Echo 6/15: EF 55-60, normal wall motion, grade 1 diastolic dysfunction   Hyperlipidemia    Hypertension    Hypothyroidism    hypothyroid   Nuclear stress test    Nuc study 9/16: no ischemia, EF 69; low risk   Stroke (HCC)    2012 and march 2024     Past Surgical History:   Past Surgical History:  Procedure Laterality Date   APPENDECTOMY     CARDIAC CATHETERIZATION     CARPAL TUNNEL RELEASE     cataracts     LEFT HEART CATHETERIZATION WITH CORONARY ANGIOGRAM N/A 04/29/2013   Procedure: LEFT HEART CATHETERIZATION WITH CORONARY ANGIOGRAM;  Surgeon: Peter M Swaziland, MD;  Location: [facility] CATH LAB;  Service: Cardiovascular;  Laterality: N/A;   LUMBAR LAMINECTOMY/DECOMPRESSION MICRODISCECTOMY N/A 01/12/2017   Procedure: LAMINECTOMY AND FORAMINOTOMY LUMBAR 4- LUMBAR 5;  Surgeon: Coletta Memos, MD;  Location: [facility] OR;  Service: Neurosurgery;  Laterality: N/A;  LAMINECTOMY AND FORAMINOTOMY LUMBAR 4- LUMBAR 5     Past OB/GYN History: OB History  Gravida Para Term Preterm AB Living  5       2 2   SAB IAB Ectopic Multiple Live Births  2       3    # Outcome Date GA Lbr Len/2nd Weight Sex Type Anes PTL Lv  5 Gravida           4 Gravida           3 Gravida           2 SAB           1 SAB             Vaginal deliveries: 3,  Forceps/ Vacuum deliveries: 0, Cesarean section: 0 Menopausal: Denies vaginal bleeding since menopause Any history of abnormal pap smears: no.   Medications: Patient has a current medication list which includes the following prescription(s): albuterol, amlodipine, aspirin ec, atorvastatin, carvedilol, darifenacin, donepezil, hydralazine, hydrocortisone, levothyroxine, meclizine, metformin,  nitroglycerin, olmesartan, and omeprazole, and the following Facility-Administered Medications: triamcinolone acetonide.   Allergies: Patient is allergic to actos [pioglitazone], invokana [canagliflozin], and no known allergies.   Social History:  Social History   Tobacco Use   Smoking status: Never   Smokeless tobacco: Never  Vaping Use   Vaping status: Never Used  Substance Use Topics   Alcohol use: No   Drug use: No    Relationship status: married Patient lives with husband.   Patient is not employed. Regular exercise: No History of abuse: No  Family History:   Family History  Problem Relation Age of Onset   Diabetes Mellitus II Mother    CAD Father    Cancer Maternal Aunt      Review of Systems: Review of Systems  Constitutional:  Positive for malaise/fatigue. Negative for fever and weight loss.  Respiratory:  Negative for cough, shortness of breath and wheezing.   Cardiovascular:  Negative for chest pain, palpitations and leg swelling.  Gastrointestinal:  Negative for abdominal pain and blood in stool.  Genitourinary:  Negative for dysuria.  Musculoskeletal:  Positive for myalgias.  Skin:  Negative for rash.  Neurological:  Negative for dizziness and headaches.  Endo/Heme/Allergies:  Bruises/bleeds easily.       + hot flashes  Psychiatric/Behavioral:  Positive for depression. The patient is not nervous/anxious.      OBJECTIVE Physical Exam: Vitals:   05/03/23 0949  BP: 137/77  Pulse: 65  Weight: 176 lb (79.8 kg)  Height: 5' 2.6" (1.59 m)    Physical Exam Constitutional:      General: She is not in acute distress. Pulmonary:     Effort: Pulmonary effort is normal.  Abdominal:     General: There is no distension.     Palpations: Abdomen is soft.     Tenderness: There is no abdominal tenderness. There is no rebound.  Musculoskeletal:        General: No swelling. Normal range of motion.  Skin:    General: Skin is warm and dry.     Findings: No  rash.  Neurological:     Mental Status: She is alert and oriented to person, place, and time.     Motor: No weakness.  Psychiatric:        Mood and Affect: Mood normal.        Behavior: Behavior normal.      GU / Detailed Urogynecologic Evaluation:  Pelvic Exam: Normal external female genitalia; Bartholin's and Skene's glands normal in appearance; urethral meatus normal in appearance, no urethral masses or discharge.   CST: negative  Speculum exam reveals normal vaginal mucosa with atrophy. Cervix normal appearance. Uterus normal single, nontender. Adnexa no mass, fullness, tenderness.    Pelvic floor strength I/V, puborectalis II/V external anal sphincter II/V  Pelvic floor musculature: Right levator non-tender, Right obturator non-tender, Left levator non-tender, Left obturator non-tender  POP-Q:   POP-Q  0.5                                            Aa   0.5                                           Ba  -6.5                                              C   3                                            Gh  3.5                                            Pb  7                                            tvl   -2                                            Ap  -2                                            Bp  -6                                              D      Rectal Exam:  Normal sphincter tone, no distal rectocele, enterocoele not present, no rectal masses, no sign of dyssynergia when asking the patient to bear down.  Post-Void Residual (PVR) by Bladder Scan: In order to evaluate bladder emptying, we discussed obtaining a postvoid residual and patient agreed to this procedure.  Procedure: The ultrasound unit was placed on the patient's abdomen in the suprapubic region after the patient had voided.    Post Void Residual - 05/03/23 1003       Post Void Residual   Post Void Residual 1 mL              Laboratory Results: Lab Results  Component  Value Date   BILIRUBINUR  NEGATIVE 01/12/2014   PROTEINUR NEGATIVE 01/12/2014   UROBILINOGEN 0.2 01/12/2014   LEUKOCYTESUR TRACE (A) 01/12/2014    Lab Results  Component Value Date   CREATININE 1.16 (H) 05/05/2022   CREATININE 1.01 (H) 06/01/2018   CREATININE 0.97 01/05/2017    Lab Results  Component Value Date   HGBA1C 6.6 (H) 01/05/2017    Lab Results  Component Value Date   HGB 11.9 06/01/2018   Unable to leave urine sample as she voided prior to the appt.   ASSESSMENT AND PLAN Ms. Kable is a 77 y.o. with:  1. Overactive bladder   2. Prolapse of anterior vaginal wall   3. Incontinence of feces, unspecified fecal incontinence type    OAB - We discussed the symptoms of overactive bladder (OAB), which include urinary urgency, urinary frequency, nocturia, with or without urge incontinence.  While we do not know the exact etiology of OAB, several treatment options exist. We discussed management including behavioral therapy (decreasing bladder irritants, urge suppression strategies, timed voids, bladder retraining), physical therapy, medication; for refractory cases posterior tibial nerve stimulation, sacral neuromodulation, and intravesical botulinum toxin injection.  - Discussed that darifenacin is not recommended in her age group due to risk of cognitive dysfunction. She is interested in PTNS but wants to continue the medication until she starts. We did discuss we can try an alternative medication in addition to PTNS if necessary.  - Will have her fill out a baseline bladder diary prior to her first session.  - decrease coffee, tea and soda intake  2. Stage II anterior, Stage I posterior, Stage I apical prolapse - For treatment of pelvic organ prolapse, we discussed options for management including expectant management, conservative management, and surgical management, such as Kegels, a pessary, pelvic floor physical therapy, and specific surgical procedures. - She is not a  good surgical candidate since she had a stroke 6 months ago. May be able to consider in the future.  - She may be interested in a pessary but wants to deal with her OAB first.   3. Accidental Bowel Leakage:  - Treatment options include anti-diarrhea medication (loperamide/ Imodium OTC or prescription lomotil), fiber supplements, physical therapy, and possible sacral neuromodulation or surgery.   - Recommended starting with stool bulking with daily metamucil.   Return for PTNS  Marguerita Beards, MD

## 2023-05-03 NOTE — Patient Instructions (Addendum)
Today we talked about ways to manage bladder urgency such as altering your diet to avoid irritative beverages and foods (bladder diet) as well as attempting to decrease stress and other exacerbating factors.   The Most Bothersome Foods* The Least Bothersome Foods*  Coffee - Regular & Decaf Tea - caffeinated Carbonated beverages - cola, non-colas, diet & caffeine-free Alcohols - Beer, Red Wine, White Wine, [address] - Grapefruit, [place], Orange, [employer] - Cranberry, Grapefruit, Orange, Pineapple Vegetables - Tomato & Tomato Products Flavor Enhancers - Hot peppers, Spicy foods, Chili, Horseradish, Vinegar, Monosodium glutamate (MSG) Artificial Sweeteners - NutraSweet, Sweet 'N Low, Equal (sweetener), Saccharin Ethnic foods - [place], [place], [place] food [employer] - low-fat & whole Fruits - Bananas, Blueberries, Honeydew melon, Pears, Raisins, Watermelon Vegetables - Broccoli, [address] Sprouts, [place], Carrots, Cauliflower, [place], Cucumber, Mushrooms, Peas, Radishes, Squash, Zucchini, White potatoes, Sweet potatoes & yams Poultry - Chicken, Eggs, [place], [employer] - Beef, Diplomatic Services operational officer, Lamb Seafood - Shrimp, [place] fish, Salmon Grains - Oat, Rice Snacks - Pretzels, Popcorn  *Lenward Chancellor et al. Diet and its role in interstitial cystitis/bladder pain syndrome (IC/BPS) and comorbid conditions. [employer]. BJU Int. 2012 Jan 11.   Accidental Bowel Leakage: Our goal is to achieve formed bowel movements daily or every-other-day without leakage.  You may need to try different combinations of the following options to find what works best for you.  Some management options include: Dietary changes (more leafy greens, vegetables and fruits; less processed foods) Fiber supplementation (Metamucil or something with psyllium as active ingredient)- take once a day Over-the-counter imodium (tablets or liquid) to help solidify the stool and prevent leakage of stool.

## 2023-05-19 ENCOUNTER — Telehealth: Payer: Self-pay | Admitting: Obstetrics and Gynecology

## 2023-05-19 ENCOUNTER — Ambulatory Visit: Payer: Medicare Other | Admitting: Obstetrics and Gynecology

## 2023-05-19 MED ORDER — VIBEGRON 75 MG PO TABS: 75.0000 mg | ORAL_TABLET | Freq: Every day | ORAL | 2 refills | Status: DC

## 2023-05-19 NOTE — Telephone Encounter (Signed)
Patient was planning to start PTNS today for her overactive bladder symptoms.  She discovered that she would have a co-pay at each visit which was not affordable for her.  She requested that we change her medication as Dr. Florian Buff had mentioned at her previous visit.  Samples given of Gemtesa 75 mg daily (Lot [id number] Exp Oct 2027)  Will send in a prescription for this medication.  As she is already on memory medication she is not a candidate for other anticholinergic medications including oxybutynin, Detrol, trospium, Vesicare.  Patient is also on medication for her blood pressure which means she is not a good candidate for Myrbetriq.  This is why we chose the medication Gemtesa as it is one of the safest options for patient.

## 2023-05-26 ENCOUNTER — Ambulatory Visit: Payer: Medicare Other | Admitting: Obstetrics and Gynecology

## 2023-06-02 ENCOUNTER — Ambulatory Visit: Payer: Medicare Other | Admitting: Obstetrics and Gynecology

## 2023-06-07 ENCOUNTER — Telehealth: Payer: Self-pay

## 2023-06-07 DIAGNOSIS — N3281 Overactive bladder: Secondary | ICD-10-CM

## 2023-06-07 NOTE — Telephone Encounter (Signed)
Patient called and said she has been using the Gemtesa samples and plans not to not fill the prescription. She states its not working, she is getting up 5-6 times a night and is having daytime leakage occurring. Please advise on alternative treatment.

## 2023-06-08 MED ORDER — TROSPIUM CHLORIDE ER 60 MG PO CP24: 60.0000 mg | ORAL_CAPSULE | Freq: Every day | ORAL | 5 refills | Status: DC

## 2023-06-08 NOTE — Telephone Encounter (Signed)
Tried to call the patient x2. Voicemail box is full. Will try again later.

## 2023-06-08 NOTE — Telephone Encounter (Signed)
Please inform patient I have sent in Trospium 60mg  daily for patient to try. If this does not work she can come back and discuss third line therapy options such as bladder botox or SNM. Most common side effects of Trospium are Dry eyes, dry mouth and constipation.

## 2023-06-09 ENCOUNTER — Ambulatory Visit: Payer: Medicare Other | Admitting: Obstetrics and Gynecology

## 2023-06-09 NOTE — Telephone Encounter (Signed)
Katrina Swanson would like to continue on the Gemtesa samples as the last few days have been better. She is aware that we have also sent the Trospium in to her pharmacy. She will try this if no improvement with the [facility].

## 2023-06-16 ENCOUNTER — Ambulatory Visit: Payer: Medicare Other | Admitting: Obstetrics and Gynecology

## 2023-06-21 ENCOUNTER — Ambulatory Visit: Payer: Medicare Other | Admitting: Obstetrics and Gynecology

## 2023-07-04 ENCOUNTER — Telehealth: Payer: Self-pay

## 2023-07-04 DIAGNOSIS — N3281 Overactive bladder: Secondary | ICD-10-CM

## 2023-07-04 MED ORDER — TROSPIUM CHLORIDE ER 60 MG PO CP24: 60.0000 mg | ORAL_CAPSULE | Freq: Every day | ORAL | 5 refills | Status: AC

## 2023-07-04 NOTE — Telephone Encounter (Signed)
Patient called to change her pharmacy to [facility] in [place], [place] due to insurance not covering her old pharmacy.
